# Patient Record
Sex: Female | Born: 1995 | Race: Black or African American | Hispanic: No | Marital: Single | Smoking: Never smoker
Health system: Southern US, Community
[De-identification: ages and names within clinical notes are randomized; demographics above are authoritative.]

## PROBLEM LIST (undated history)

## (undated) DIAGNOSIS — R51 Headache: Secondary | ICD-10-CM

## (undated) DIAGNOSIS — F32A Depression, unspecified: Secondary | ICD-10-CM

## (undated) DIAGNOSIS — F419 Anxiety disorder, unspecified: Secondary | ICD-10-CM

## (undated) DIAGNOSIS — F909 Attention-deficit hyperactivity disorder, unspecified type: Secondary | ICD-10-CM

## (undated) DIAGNOSIS — R519 Headache, unspecified: Secondary | ICD-10-CM

## (undated) DIAGNOSIS — F329 Major depressive disorder, single episode, unspecified: Secondary | ICD-10-CM

## (undated) HISTORY — DX: Major depressive disorder, single episode, unspecified: F32.9

## (undated) HISTORY — DX: Anxiety disorder, unspecified: F41.9

## (undated) HISTORY — DX: Depression, unspecified: F32.A

---
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
## 2017-12-01 ENCOUNTER — Emergency Department
Admission: EM | Admit: 2017-12-01 | Discharge: 2017-12-01 | Disposition: A | Discharge status: Home / Self Care | Payer: BLUE CROSS/BLUE SHIELD | Source: Home / Self Care | Attending: Family Medicine | Admitting: Family Medicine

## 2017-12-01 ENCOUNTER — Encounter: Payer: Self-pay | Admitting: *Deleted

## 2017-12-01 ENCOUNTER — Other Ambulatory Visit: Payer: Self-pay

## 2017-12-01 ENCOUNTER — Emergency Department (INDEPENDENT_AMBULATORY_CARE_PROVIDER_SITE_OTHER): Payer: BLUE CROSS/BLUE SHIELD

## 2017-12-01 DIAGNOSIS — M25552 Pain in left hip: Secondary | ICD-10-CM

## 2017-12-01 NOTE — ED Triage Notes (Signed)
Patient c/o 1 week of left hip pain without injury.

## 2017-12-01 NOTE — Discharge Instructions (Signed)
°  You may take 500mg  acetaminophen every 4-6 hours or in combination with ibuprofen 400-600mg  every 6-8 hours as needed for pain and inflammation.  Please call to schedule an appointment with Sports Medicine by next week for recheck of symptoms and for ongoing muscle and joint pain including hip, back, and shoulder pain.

## 2017-12-01 NOTE — ED Provider Notes (Signed)
[facility]    CSN: [id number] Arrival date & time: 12/01/17  1657     History   Chief Complaint Chief Complaint  Patient presents with  . Hip Pain    HPI Melanie Meyer is a 22 y.o. female.   HPI  Melanie Meyer is a 22 y.o. female presenting to UC with mother c/o Left hip pain that is aching and sore, waxing and waning for about 1 week. No known injury.   She initially thought she had slept wrong but pain has continued. She has taken ibuprofen a few times with mild temporary relief. Denies radiation of pain. No rash or bruising. No prior hx of hip pain but she has been seen at [facility] for back and shoulder pain in the past. She has not been seen by an orthopedist or sports medicine for her musculoskeletal pain.    Past Medical History:  Diagnosis Date  . Frequent headaches     Patient Active Problem List   Diagnosis Date Noted  . ANKLE SPRAIN, RIGHT 06/03/2009    History reviewed. No pertinent surgical history.  OB History   None      Home Medications    Prior to Admission medications   Medication Sig Start Date End Date Taking? Authorizing Provider  cyclobenzaprine (FLEXERIL) 5 MG tablet Take 1-2 tablets (5-10 mg total) by mouth 2 (two) times daily as needed for muscle spasms. 08/30/16  Yes Vanda Waskey O, PA-C  FLUoxetine (PROZAC) 40 MG capsule Take 40 mg by mouth daily.   Yes [provider]  norethindrone-ethinyl estradiol (JUNEL FE,GILDESS FE,LOESTRIN FE) 1-20 MG-MCG tablet Take 1 tablet by mouth daily.   Yes [provider]    Family History History reviewed. No pertinent family history.  Social History Social History   Tobacco Use  . Smoking status: Never Smoker  . Smokeless tobacco: Never Used  Substance Use Topics  . Alcohol use: No  . Drug use: No     Allergies   Patient has no known allergies.   Review of Systems Review of Systems  Musculoskeletal: Positive for arthralgias and myalgias. Negative for  back pain, gait problem and joint swelling.  Neurological: Negative for weakness and numbness.     Physical Exam Triage Vital Signs ED Triage Vitals  Enc Vitals Group     BP 12/01/17 1714 112/76     Pulse Rate 12/01/17 1714 77     Resp --      Temp --      Temp src --      SpO2 12/01/17 1714 99 %     Weight 12/01/17 1715 107 lb (48.5 kg)     Height --      Head Circumference --      Peak Flow --      Pain Score 12/01/17 1715 5     Pain Loc --      Pain Edu? --      Excl. in GC? --    No data found.  Updated Vital Signs BP 112/76 (BP Location: Right Arm)   Pulse 77   Wt 107 lb (48.5 kg)   LMP 11/09/2017   SpO2 99%   BMI 20.22 kg/m   Visual Acuity Right Eye Distance:   Left Eye Distance:   Bilateral Distance:    Right Eye Near:   Left Eye Near:    Bilateral Near:     Physical Exam  Constitutional: She is oriented to person, place, and time. She  appears well-developed and well-nourished.  HENT:  Head: Normocephalic and atraumatic.  Eyes: EOM are normal.  Neck: Normal range of motion.  Cardiovascular: Normal rate.  Pulmonary/Chest: Effort normal.  Musculoskeletal: Normal range of motion. She exhibits tenderness. She exhibits no edema.  Left hip: tenderness to muscle and joint. Full ROM. Normal gait. No tenderness to lumbar spine or muscles.  Neurological: She is alert and oriented to person, place, and time.  Skin: Skin is warm and dry.  Psychiatric: She has a normal mood and affect. Her behavior is normal.  Nursing note and vitals reviewed.    UC Treatments / Results  Labs (all labs ordered are listed, but only abnormal results are displayed) Labs Reviewed - No data to display  EKG None  Radiology Dg Hip Unilat W Or Wo Pelvis 2-3 Views Left  Result Date: 12/01/2017 CLINICAL DATA:  Left hip pain for the past week.  No known injury. EXAM: DG HIP (WITH OR WITHOUT PELVIS) 2-3V LEFT COMPARISON:  None. FINDINGS: There is no evidence of hip fracture or  dislocation. There is no evidence of arthropathy or other focal bone abnormality. IMPRESSION: Negative. Electronically Signed   By: Obie Dredge M.D.   On: 12/01/2017 17:58    Procedures Procedures (including critical care time)  Medications Ordered in UC Medications - No data to display  Initial Impression / Assessment and Plan / UC Course  I have reviewed the triage vital signs and the nursing notes.  Pertinent labs & imaging results that were available during my care of the patient were reviewed by me and considered in my medical decision making (see chart for details).     Reassured pt and mother of normal plain films Encouraged symptomatic treatment Pt info packet provided. Also encouraged f/u with Sports Medicine.  Final Clinical Impressions(s) / UC Diagnoses   Final diagnoses:  Left hip pain     Discharge Instructions      You may take 500mg  acetaminophen every 4-6 hours or in combination with ibuprofen 400-600mg  every 6-8 hours as needed for pain and inflammation.  Please call to schedule an appointment with Sports Medicine by next week for recheck of symptoms and for ongoing muscle and joint pain including hip, back, and shoulder pain.      ED Prescriptions    None     Controlled Substance Prescriptions [place] Controlled Substance Registry consulted? Not Applicable   Rolla Plate 12/01/17 Rickey Primus

## 2018-03-27 ENCOUNTER — Encounter (HOSPITAL_COMMUNITY): Payer: Self-pay | Admitting: Psychiatry

## 2018-03-27 ENCOUNTER — Encounter (INDEPENDENT_AMBULATORY_CARE_PROVIDER_SITE_OTHER): Payer: Self-pay

## 2018-03-27 ENCOUNTER — Ambulatory Visit (INDEPENDENT_AMBULATORY_CARE_PROVIDER_SITE_OTHER): Payer: BLUE CROSS/BLUE SHIELD | Admitting: Psychiatry

## 2018-03-27 ENCOUNTER — Other Ambulatory Visit: Payer: Self-pay

## 2018-03-27 DIAGNOSIS — F324 Major depressive disorder, single episode, in partial remission: Secondary | ICD-10-CM | POA: Diagnosis not present

## 2018-03-27 DIAGNOSIS — F411 Generalized anxiety disorder: Secondary | ICD-10-CM | POA: Diagnosis not present

## 2018-03-27 MED ORDER — ESCITALOPRAM OXALATE 20 MG PO TABS: 20.0000 mg | ORAL_TABLET | Freq: Every day | ORAL | 1 refills | Status: DC

## 2018-03-27 NOTE — Progress Notes (Signed)
Psychiatric Initial Adult Assessment   Patient Identification: Melanie Meyer MRN:  [id number] Date of Evaluation:  03/27/2018 [facility] Chief Complaint:   Chief Complaint    Establish Care; Anxiety; Depression; Panic Attack     Visit Diagnosis:    ICD-10-CM   1. Major depression single episode, in partial remission (HCC) F32.4   2. 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  Her family is from [place] and includes both parents 47 year old brother and 67 year old sister.  The patient also works part-time at the [employer].  The patient was referred by [facility] pediatrics for further follow-up and treatment of depression and anxiety.  The patient states that she did have some feelings of anxiety through her high school years but it was never very severe.  She saw a counselor for short time and the seem to be alleviated.  However during her freshman year at [employer] she had a bad anxiety episode 1 night in the dorm when she was trying to do homework and became overwhelmed and frightened.  At that time she was also very anxious and depressed.  She did not feel that high school had prepared her for college and she felt extremely overwhelmed.  She saw therapist and a psychiatrist at the school and was started on Prozac she states that she had trouble getting into the psychiatrist after a while because the doctor was only there part-time and it did not mesh with her schedule.  She stayed on the Prozac "on and off".  She did not like the way it made her feel and that she felt more unfocused and unmotivated.  A couple of months ago her anxiety and depression worsened and she did not know where to turn for help.  Her parents brought her to [facility] and she was started on Lexapro 20 mg daily.  She is also seeing a  therapist at [facility] again on a weekly basis.  She states that she is feeling a lot better now but prior to going on the Lexapro she was having panic attacks feeling anxious depressed sad and even some suicidal ideation.  Last summer she attempted to cut herself for short time but then her brother found out and she stopped.  Before going on medication she also had difficulty sleeping but this is going better now.  She had some difficulties with academics in the beginning of college because she was not really prepared but is doing much better now and making use of the academic support at the school.  She has never had any psychiatric hospitalizations or serious suicide attempts.  She is never had psychotic symptoms and does not use drugs alcohol cigarettes or vaping.  Associated Signs/Symptoms: Depression Symptoms:  depressed mood, anhedonia, psychomotor retardation, difficulty concentrating, anxiety, (Hypo) Manic Symptoms: Anxiety Symptoms:  Excessive Worry, Panic Symptoms, Psychotic Symptoms:   PTSD Symptoms: No history of past trauma or abuse  Past Psychiatric History: Prior outpatient treatment at [facility]  Previous Psychotropic Medications: Yes   Substance Abuse History in the last 12 months:  No.  Consequences of Substance Abuse: Negative  Past Medical History:  Past Medical History:  Diagnosis Date  . Anxiety   . Depression   . Frequent headaches    History reviewed. No pertinent surgical history.  Family Psychiatric History: Both brother and sister have depression and  anxiety.  Family History:  Family History  Problem Relation Age of Onset  . Depression Sister   . Anxiety disorder Sister   . Depression Brother   . Anxiety disorder Brother     Social History:   Social History   Socioeconomic History  . Marital status: Single    Spouse name: Not on file  . Number of children: 0  . Years of education: Not on file  . Highest education level: Some college, no degree   Occupational History  . Not on file  Social Needs  . Financial resource strain: Very hard  . Food insecurity:    Worry: Often true    Inability: Often true  . Transportation needs:    Medical: No    Non-medical: No  Tobacco Use  . Smoking status: Never Smoker  . Smokeless tobacco: Never Used  Substance and Sexual Activity  . Alcohol use: No  . Drug use: No  . Sexual activity: Never  Lifestyle  . Physical activity:    Days per week: 1 day    Minutes per session: 100 min  . Stress: Rather much  Relationships  . Social connections:    Talks on phone: Not on file    Gets together: Not on file    Attends religious service: More than 4 times per year    Active member of club or organization: Yes    Attends meetings of clubs or organizations: More than 4 times per year    Relationship status: Never married  Other Topics Concern  . Not on file  Social History Narrative  . Not on file    Additional Social History: The patient grew up in [place] with both parents and 2 siblings.  She states that she had a happy childhood and does not endorse any past abuse or trauma.  She did well in high school at [employer] but claims it was "too easy" and not challenging enough to prepare for college.  She enjoys college now has numerous friends but is not dating.  She likes to go to movies or watch movies in her dorm.  She exercises about once a week  Allergies:  No Known Allergies  Metabolic Disorder Labs: No results found for: HGBA1C, MPG No results found for: PROLACTIN No results found for: CHOL, TRIG, HDL, CHOLHDL, VLDL, LDLCALC No results found for: TSH  Therapeutic Level Labs: No results found for: LITHIUM No results found for: CBMZ No results found for: VALPROATE  Current Medications: Current Outpatient Medications  Medication Sig Dispense Refill  . escitalopram (LEXAPRO) 20 MG tablet Take 1 tablet (20 mg total) by mouth daily. 90 tablet 1  . norethindrone-ethinyl  estradiol (JUNEL FE,GILDESS FE,LOESTRIN FE) 1-20 MG-MCG tablet Take 1 tablet by mouth daily.    . rizatriptan (MAXALT) 5 MG tablet TAKE 1 TABLET (5 MG TOTAL) BY MOUTH AS NEEDED FOR MIGRAINE. MAY REPEAT IN 2 HOURS IF NEEDED    . triamcinolone cream (KENALOG) 0.1 % Apply to atopic dermatitis on ears and neck 1-2 times a day as needed     No current facility-administered medications for this visit.     Musculoskeletal: Strength & Muscle Tone: within normal limits Gait & Station: normal Patient leans: N/A  Psychiatric Specialty Exam: Review of Systems  Neurological: Positive for headaches.  All other systems reviewed and are negative.   Blood pressure 122/84, pulse 81, temperature 99 F (37.2 C), temperature source Oral, weight 105 lb 3.2 oz (47.7 kg), last menstrual  period 03/01/2018, SpO2 97 %.Body mass index is 19.88 kg/m.  General Appearance: Casual, Neat and Well Groomed  Eye Contact:  Good  Speech:  Clear and Coherent  Volume:  Normal  Mood:  Euthymic  Affect:  Appropriate and Congruent  Thought Process:  Goal Directed  Orientation:  Full (Time, Place, and Person)  Thought Content:  WDL  Suicidal Thoughts:  No  Homicidal Thoughts:  No  Memory:  Immediate;   Good Recent;   Good Remote;   Good  Judgement:  Good  Insight:  Good  Psychomotor Activity:  Normal  Concentration:  Concentration: Good and Attention Span: Good  Recall:  Good  Fund of Knowledge:Good  Language: Good  Akathisia:  No  Handed:  Right  AIMS (if indicated):  not done  Assets:  Communication Skills Desire for Improvement Physical Health Resilience Social Support Talents/Skills Vocational/Educational  ADL's:  Intact  Cognition: WNL  Sleep:  Fair   Screenings:   Assessment and Plan: This patient is a 23 year old female with a history of depression anxiety and panic attacks.  Fortunately she is in [facility].  She seems to be having a good response to Lexapro with much diminishment  of symptoms.  She will continue Lexapro 20 mg daily.  I have advised her to return for checkup here in 6 weeks with a psychiatrist.   Diannia Rudereborah Trevione Wert, MD 2/8/20202:37 PM

## 2018-04-07 ENCOUNTER — Ambulatory Visit (HOSPITAL_COMMUNITY): Payer: Self-pay | Admitting: Psychiatry

## 2018-04-20 ENCOUNTER — Other Ambulatory Visit: Payer: Self-pay

## 2018-04-20 ENCOUNTER — Emergency Department
Admission: EM | Admit: 2018-04-20 | Discharge: 2018-04-20 | Disposition: A | Discharge status: Home / Self Care | Payer: BLUE CROSS/BLUE SHIELD | Source: Home / Self Care

## 2018-04-20 DIAGNOSIS — R110 Nausea: Secondary | ICD-10-CM

## 2018-04-20 DIAGNOSIS — G44209 Tension-type headache, unspecified, not intractable: Secondary | ICD-10-CM

## 2018-04-20 MED ORDER — ONDANSETRON 4 MG PO TBDP
4.0000 mg | ORAL_TABLET | Freq: Once | ORAL | Status: AC
  Administered 2018-04-20: 4 mg via ORAL

## 2018-04-20 MED ORDER — KETOROLAC TROMETHAMINE 60 MG/2ML IM SOLN
60.0000 mg | Freq: Once | INTRAMUSCULAR | Status: AC
  Administered 2018-04-20: 60 mg via INTRAMUSCULAR

## 2018-04-20 MED ORDER — ONDANSETRON 4 MG PO TBDP: 4.0000 mg | ORAL_TABLET | Freq: Three times a day (TID) | ORAL | 0 refills | Status: DC | PRN

## 2018-04-20 MED ORDER — CYCLOBENZAPRINE HCL 5 MG PO TABS: 5.0000 mg | ORAL_TABLET | Freq: Two times a day (BID) | ORAL | 0 refills | Status: DC | PRN

## 2018-04-20 NOTE — Discharge Instructions (Signed)
  You may take 500mg acetaminophen every 4-6 hours or in combination with ibuprofen 400-600mg every 6-8 hours as needed for pain and inflammation.  

## 2018-04-20 NOTE — ED Provider Notes (Signed)
[facility]    CSN: [id number] Arrival date & time: 04/20/18  1857     History   Chief Complaint Chief Complaint  Patient presents with  . Nausea    HPI Melanie Meyer is a 23 y.o. female.   HPI  Melanie Meyer is a 23 y.o. female presenting to UC with c/o neck tightness with associated posterior headache and nausea.  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  She has been working this shift for about 6 months and had not had any problems until this past 1 week.  The HA is a pressure-like sensation.  She has tried Maxalt, which she has for migraines but no relief. Her usual migraines are in the front or side of her head. Denies recent head injury. No change in vision, balance or speech.  She has not tried any Tylenol or Motrin because she thought it was a migraine so she only tried the Maxalt. Denies concern for pregnancy.  LMP 03/22/2018.    Past Medical History:  Diagnosis Date  . Anxiety   . Depression   . Frequent headaches     Patient Active Problem List   Diagnosis Date Noted  . Major depression single episode, in partial remission (HCC) 03/27/2018  . Generalized anxiety disorder 03/27/2018  . Dysmenorrhea in adolescent 10/25/2013  . Headache 11/17/2012  . ANKLE SPRAIN, RIGHT 06/03/2009    History reviewed. No pertinent surgical history.  OB History   No obstetric history on file.      Home Medications    Prior to Admission medications   Medication Sig Start Date End Date Taking? Authorizing Provider  cyclobenzaprine (FLEXERIL) 5 MG tablet Take 1-2 tablets (5-10 mg total) by mouth 2 (two) times daily as needed for muscle spasms. 04/20/18   Lurene ShadowPhelps, Markelle Najarian O, PA-C  escitalopram (LEXAPRO) 20 MG tablet Take 1 tablet (20 mg total) by mouth daily. 03/27/18   Myrlene Brokeross, Deborah R, MD  fluticasone (CUTIVATE) 0.05 % cream APPLY TO AFFECTED AREA EVERY DAY 03/31/18   [provider]  norethindrone-ethinyl estradiol  (JUNEL FE,GILDESS FE,LOESTRIN FE) 1-20 MG-MCG tablet Take 1 tablet by mouth daily.    [provider]  ondansetron (ZOFRAN ODT) 4 MG disintegrating tablet Take 1 tablet (4 mg total) by mouth every 8 (eight) hours as needed. 4mg  ODT q4 hours prn nausea/vomit 04/20/18   Jordi Kamm, Vangie BickerErin O, PA-C  rizatriptan (MAXALT) 5 MG tablet TAKE 1 TABLET (5 MG TOTAL) BY MOUTH AS NEEDED FOR MIGRAINE. MAY REPEAT IN 2 HOURS IF NEEDED 09/27/14   [provider]  triamcinolone cream (KENALOG) 0.1 % Apply to atopic dermatitis on ears and neck 1-2 times a day as needed 02/16/17   [provider]    Family History Family History  Problem Relation Age of Onset  . Depression Sister   . Anxiety disorder Sister   . Depression Brother   . Anxiety disorder Brother     Social History Social History   Tobacco Use  . Smoking status: Never Smoker  . Smokeless tobacco: Never Used  Substance Use Topics  . Alcohol use: No  . Drug use: No     Allergies   Patient has no known allergies.   Review of Systems Review of Systems  Constitutional: Negative for chills and fever.  HENT: Negative for congestion, ear pain, sore throat, trouble swallowing and voice change.   Respiratory: Negative for cough and shortness of breath.   Cardiovascular: Negative  for chest pain and palpitations.  Gastrointestinal: Positive for nausea. Negative for abdominal pain, diarrhea and vomiting.  Musculoskeletal: Positive for myalgias, neck pain and neck stiffness. Negative for arthralgias and back pain.  Skin: Negative for rash.  Neurological: Positive for headaches. Negative for dizziness, weakness, light-headedness and numbness.     Physical Exam Triage Vital Signs ED Triage Vitals  Enc Vitals Group     BP 04/20/18 1918 107/76     Pulse Rate 04/20/18 1918 69     Resp 04/20/18 1918 20     Temp 04/20/18 1918 98.3 F (36.8 C)     Temp Source 04/20/18 1918 Oral     SpO2 04/20/18 1918 99 %     Weight 04/20/18  1921 105 lb (47.6 kg)     Height 04/20/18 1921 5\' 1"  (1.549 m)     Head Circumference --      Peak Flow --      Pain Score 04/20/18 1920 5     Pain Loc --      Pain Edu? --      Excl. in GC? --    No data found.  Updated Vital Signs BP 107/76 (BP Location: Right Arm)   Pulse 69   Temp 98.3 F (36.8 C) (Oral)   Resp 20   Ht 5\' 1"  (1.549 m)   Wt 105 lb (47.6 kg)   LMP 03/22/2018   SpO2 99%   BMI 19.84 kg/m   Visual Acuity Right Eye Distance:   Left Eye Distance:   Bilateral Distance:    Right Eye Near:   Left Eye Near:    Bilateral Near:     Physical Exam Vitals signs and nursing note reviewed.  Constitutional:      Appearance: Normal appearance. She is well-developed.  HENT:     Head: Normocephalic and atraumatic.     Right Ear: Tympanic membrane normal.     Left Ear: Tympanic membrane normal.     Nose: Nose normal.     Right Sinus: No maxillary sinus tenderness or frontal sinus tenderness.     Left Sinus: No maxillary sinus tenderness or frontal sinus tenderness.     Mouth/Throat:     Lips: Pink.     Mouth: Mucous membranes are moist.     Pharynx: Oropharynx is clear. Uvula midline.  Eyes:     Extraocular Movements: Extraocular movements intact.     Conjunctiva/sclera: Conjunctivae normal.     Pupils: Pupils are equal, round, and reactive to light.  Neck:     Musculoskeletal: Normal range of motion and neck supple. Muscular tenderness present. No neck rigidity, torticollis or spinous process tenderness.  Cardiovascular:     Rate and Rhythm: Normal rate and regular rhythm.  Pulmonary:     Effort: Pulmonary effort is normal. No respiratory distress.     Breath sounds: Normal breath sounds. No stridor. No wheezing or rhonchi.  Musculoskeletal: Normal range of motion.  Skin:    General: Skin is warm and dry.     Capillary Refill: Capillary refill takes less than 2 seconds.  Neurological:     General: No focal deficit present.     Mental Status: She is  alert and oriented to person, place, and time.     Cranial Nerves: No cranial nerve deficit.     Sensory: No sensory deficit.     Coordination: Coordination normal.     Gait: Gait normal.  Psychiatric:        Mood  and Affect: Mood normal.        Behavior: Behavior normal.      UC Treatments / Results  Labs (all labs ordered are listed, but only abnormal results are displayed) Labs Reviewed - No data to display  EKG None  Radiology No results found.  Procedures Procedures (including critical care time)  Medications Ordered in UC Medications  ketorolac (TORADOL) injection 60 mg (60 mg Intramuscular Given 04/20/18 2000)  ondansetron (ZOFRAN-ODT) disintegrating tablet 4 mg (4 mg Oral Given 04/20/18 2000)    Initial Impression / Assessment and Plan / UC Course  I have reviewed the triage vital signs and the nursing notes.  Pertinent labs & imaging results that were available during my care of the patient were reviewed by me and considered in my medical decision making (see chart for details).     Hx and exam c/w tension headache Normal neuro exam Home care info provided  Final Clinical Impressions(s) / UC Diagnoses   Final diagnoses:  Tension headache  Nausea without vomiting     Discharge Instructions      You may take 500mg  acetaminophen every 4-6 hours or in combination with ibuprofen 400-600mg  every 6-8 hours as needed for pain and inflammation.       ED Prescriptions    Medication Sig Dispense Auth. Provider   cyclobenzaprine (FLEXERIL) 5 MG tablet Take 1-2 tablets (5-10 mg total) by mouth 2 (two) times daily as needed for muscle spasms. 30 tablet Doroteo Glassman, Alija Riano O, PA-C   ondansetron (ZOFRAN ODT) 4 MG disintegrating tablet Take 1 tablet (4 mg total) by mouth every 8 (eight) hours as needed. 4mg  ODT q4 hours prn nausea/vomit 12 tablet Lurene Shadow, PA-C     Controlled Substance Prescriptions [place] Controlled Substance Registry consulted? Not Applicable     Rolla Plate 04/23/18 9977

## 2018-04-20 NOTE — ED Triage Notes (Signed)
Nausea for about 1 week.  Comes home from working nights, and feels nauseated.  Denies vomiting.  Slight headache in the back of head, has been happening more with the nausea.  Headache feels like something is pushing on the back of head.  Has taken maxalt for the headache, nothing for nausea.  Pt feels like the maxalt helps, but is worried about the amount of Maxalt she is taking.

## 2018-05-01 ENCOUNTER — Other Ambulatory Visit: Payer: Self-pay

## 2018-05-01 ENCOUNTER — Encounter (HOSPITAL_COMMUNITY): Payer: Self-pay | Admitting: Psychiatry

## 2018-05-01 ENCOUNTER — Ambulatory Visit (INDEPENDENT_AMBULATORY_CARE_PROVIDER_SITE_OTHER): Payer: BLUE CROSS/BLUE SHIELD | Admitting: Psychiatry

## 2018-05-01 VITALS — BP 109/72 | HR 85 | Temp 98.3°F | Wt 106.4 lb

## 2018-05-01 DIAGNOSIS — F411 Generalized anxiety disorder: Secondary | ICD-10-CM | POA: Diagnosis not present

## 2018-05-01 DIAGNOSIS — F331 Major depressive disorder, recurrent, moderate: Secondary | ICD-10-CM | POA: Diagnosis not present

## 2018-05-01 DIAGNOSIS — Z818 Family history of other mental and behavioral disorders: Secondary | ICD-10-CM | POA: Diagnosis not present

## 2018-05-01 MED ORDER — ESCITALOPRAM OXALATE 20 MG PO TABS: 20.0000 mg | ORAL_TABLET | Freq: Every day | ORAL | 0 refills | Status: DC

## 2018-05-01 NOTE — Progress Notes (Signed)
BH MD/PA/NP OP Progress Note  05/01/2018 10:59 AM Earney MalletJessica Chrismer  MRN:  [id number]  Chief Complaint:  Chief Complaint    Depression; Anxiety; Follow-up     HPI: Patient reports extreme anxiety related to the current plans for dealing with the coronavirus pandemic.  She was given 3 days to make plans as [facility] has officially closed for at least 1 week.  Patient reports that being home with unstructured days typically causes her to feel unmotivated, fatigued and depressed.  She has been doing well lately.  She states that her depression seem to be under control.  She is very active doing well in school and was very motivated.  She was very social.  Sleep was good.  She is denying any SI/HI.  She was engaging in therapy and actively utilizing skills that she learned for managing anxiety.  Her anxiety while present was manageable.  She has had a few mild panic attacks.  This upcoming uncertainty has been extremely difficult for her to deal with and she is very worried that she will become depressed again.   Visit Diagnosis:    ICD-10-CM   1. GAD (generalized anxiety disorder) F41.1   2. MDD (major depressive disorder), recurrent episode, moderate (HCC) F33.1     Past Psychiatric History: reviewed. No change  Past Medical History:  Past Medical History:  Diagnosis Date  . Anxiety   . Depression   . Frequent headaches    History reviewed. No pertinent surgical history.  Family Psychiatric History:  Family History  Problem Relation Age of Onset  . Depression Sister   . Anxiety disorder Sister   . Depression Brother   . Anxiety disorder Brother     Social History:  Social History   Socioeconomic History  . Marital status: Single    Spouse name: Not on file  . Number of children: 0  . Years of education: Not on file  . Highest education level: Some college, no degree  Occupational History  . Not on file  Social Needs  . Financial resource strain: Very hard  .  Food insecurity:    Worry: Often true    Inability: Often true  . Transportation needs:    Medical: No    Non-medical: No  Tobacco Use  . Smoking status: Never Smoker  . Smokeless tobacco: Never Used  Substance and Sexual Activity  . Alcohol use: No  . Drug use: No  . Sexual activity: Never  Lifestyle  . Physical activity:    Days per week: 1 day    Minutes per session: 100 min  . Stress: Rather much  Relationships  . Social connections:    Talks on phone: Not on file    Gets together: Not on file    Attends religious service: More than 4 times per year    Active member of club or organization: Yes    Attends meetings of clubs or organizations: More than 4 times per year    Relationship status: Never married  Other Topics Concern  . Not on file  Social History Narrative  . Not on file    Allergies: No Known Allergies  Metabolic Disorder Labs: No results found for: HGBA1C, MPG No results found for: PROLACTIN No results found for: CHOL, TRIG, HDL, CHOLHDL, VLDL, LDLCALC No results found for: TSH  Therapeutic Level Labs: No results found for: LITHIUM No results found for: VALPROATE No components found for:  CBMZ  Current Medications: Current Outpatient Medications  Medication Sig Dispense Refill  . cyclobenzaprine (FLEXERIL) 5 MG tablet Take 1-2 tablets (5-10 mg total) by mouth 2 (two) times daily as needed for muscle spasms. 30 tablet 0  . EPIDUO FORTE 0.3-2.5 % GEL     . escitalopram (LEXAPRO) 20 MG tablet Take 1 tablet (20 mg total) by mouth daily. 90 tablet 0  . fluticasone (CUTIVATE) 0.05 % cream APPLY TO AFFECTED AREA EVERY DAY    . norethindrone-ethinyl estradiol (JUNEL FE,GILDESS FE,LOESTRIN FE) 1-20 MG-MCG tablet Take 1 tablet by mouth daily.    . ondansetron (ZOFRAN ODT) 4 MG disintegrating tablet Take 1 tablet (4 mg total) by mouth every 8 (eight) hours as needed. 4mg  ODT q4 hours prn nausea/vomit 12 tablet 0  . rizatriptan (MAXALT) 5 MG tablet TAKE 1  TABLET (5 MG TOTAL) BY MOUTH AS NEEDED FOR MIGRAINE. MAY REPEAT IN 2 HOURS IF NEEDED    . tacrolimus (PROTOPIC) 0.03 % ointment APPLY TO AFFECTED AREA TWICE A DAY    . triamcinolone cream (KENALOG) 0.1 % Apply to atopic dermatitis on ears and neck 1-2 times a day as needed     No current facility-administered medications for this visit.      Musculoskeletal: Strength & Muscle Tone: within normal limits Gait & Station: normal Patient leans: N/A  Psychiatric Specialty Exam: Review of Systems  Constitutional: Negative for chills, fever and malaise/fatigue.  Respiratory: Negative for cough, sputum production and shortness of breath.     Blood pressure 109/72, pulse 85, temperature 98.3 F (36.8 C), temperature source Oral, weight 106 lb 5.8 oz (48.2 kg).Body mass index is 20.1 kg/m.  General Appearance: Casual  Eye Contact:  Good  Speech:  Clear and Coherent and Pressured  Volume:  Normal  Mood:  Anxious  Affect:  Full Range  Thought Process:  Coherent and Descriptions of Associations: Circumstantial  Orientation:  Full (Time, Place, and Person)  Thought Content: Rumination   Suicidal Thoughts:  No  Homicidal Thoughts:  No  Memory:  Immediate;   Good  Judgement:  Good  Insight:  Good  Psychomotor Activity:  Normal  Concentration:  Concentration: Good  Recall:  Good  Fund of Knowledge: Good  Language: Good  Akathisia:  No  Handed:  Right  AIMS (if indicated): not done  Assets:  Communication Skills Desire for Improvement Financial Resources/Insurance Housing Social Support Talents/Skills Transportation Vocational/Educational  ADL's:  Intact  Cognition: WNL  Sleep:  Good   Screenings:   Assessment and Plan: GAD; MDD-recurrent, moderate    Medication management with supportive therapy. Risks and benefits, side effects and alternative treatment options discussed with patient. Pt was given an opportunity to ask questions about medication, illness, and treatment.  All current psychiatric medications have been reviewed and discussed with the patient and adjusted as clinically appropriate. The patient has been provided an accurate and updated list of the medications being now prescribed. Pt verbalized understanding and verbal consent obtained for treatment.   Status of current problems: very anxious and worried she will become depressed  Meds: continue Lexapro 20mg  po qD for mood and anxiety   Labs: none today  Therapy: brief supportive therapy provided. Discussed psychosocial stressors in detail.   Encouraged pt to develop daily routine and work on daily goal setting as a way to improve mood symptoms.  Reviewed ways of responding to anxiety in a productive manner   Consultations: Encouraged to follow up with therapist at [facility] Encouraged to follow up with PCP as needed  Pt  denies SI and is at an acute low risk for suicide. Patient told to call clinic if any problems occur. Patient advised to go to ER if they should develop SI/HI, side effects, or if symptoms worsen. Pt has crisis numbers to call if needed. Pt acknowledged and agreed with plan and verbalized understanding.  F/up in 1 months or sooner if needed  The duration of this appointment visit was 20 minutes of face-to-face time with the patient.  Greater than 50% of this time was spent in counseling, explanation of  diagnosis, planning of further management, and coordination of care      Oletta Darter, MD 05/01/2018, 10:59 AM

## 2018-05-03 ENCOUNTER — Other Ambulatory Visit: Payer: Self-pay

## 2018-05-03 ENCOUNTER — Encounter: Payer: Self-pay | Admitting: Emergency Medicine

## 2018-05-03 ENCOUNTER — Emergency Department
Admission: EM | Admit: 2018-05-03 | Discharge: 2018-05-03 | Disposition: A | Discharge status: Home / Self Care | Payer: BLUE CROSS/BLUE SHIELD | Source: Home / Self Care

## 2018-05-03 DIAGNOSIS — M533 Sacrococcygeal disorders, not elsewhere classified: Secondary | ICD-10-CM | POA: Diagnosis not present

## 2018-05-03 NOTE — ED Triage Notes (Signed)
Pilonidal cyst on tail bone x 1 week

## 2018-05-03 NOTE — Discharge Instructions (Signed)
°  You may take 500mg  acetaminophen every 4-6 hours or in combination with ibuprofen 400-600mg  every 6-8 hours as needed for pain and inflammation. You may also try warm baths with epson salt to ease sore muscles and joints. If you have to sit on a hard surface for prolonged periods of time, it is recommended you bring a cushion with you.  Please follow up with family medicine later this week if not improving, sooner if worsening- fever, vomiting, redness or warmth in the area of pain.

## 2018-05-03 NOTE — ED Provider Notes (Signed)
[facility]    CSN: [id number] Arrival date & time: 05/03/18  1823     History   Chief Complaint Chief Complaint  Patient presents with  . Abscess    HPI Melanie Meyer is a 23 y.o. female.   HPI  Melanie Meyer is a 23 y.o. female presenting to UC with c/o tailbone pain for 1 week. 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 Pain is aching and sore. Denies fever, chills, n/v/d. She has not taken anything for pain. No prior hx of pilonidal cyst.     Past Medical History:  Diagnosis Date  . Anxiety   . Depression   . Frequent headaches     Patient Active Problem List   Diagnosis Date Noted  . Major depression single episode, in partial remission (HCC) 03/27/2018  . Generalized anxiety disorder 03/27/2018  . Dysmenorrhea in adolescent 10/25/2013  . Headache 11/17/2012  . ANKLE SPRAIN, RIGHT 06/03/2009    History reviewed. No pertinent surgical history.  OB History   No obstetric history on file.      Home Medications    Prior to Admission medications   Medication Sig Start Date End Date Taking? Authorizing Provider  cyclobenzaprine (FLEXERIL) 5 MG tablet Take 1-2 tablets (5-10 mg total) by mouth 2 (two) times daily as needed for muscle spasms. 04/20/18   Lurene Shadow, PA-C  EPIDUO FORTE 0.3-2.5 % GEL  04/12/18   [provider]  escitalopram (LEXAPRO) 20 MG tablet Take 1 tablet (20 mg total) by mouth daily. 05/01/18   Oletta Darter, MD  fluticasone (CUTIVATE) 0.05 % cream APPLY TO AFFECTED AREA EVERY DAY 03/31/18   [provider]  norethindrone-ethinyl estradiol (JUNEL FE,GILDESS FE,LOESTRIN FE) 1-20 MG-MCG tablet Take 1 tablet by mouth daily.    [provider]  ondansetron (ZOFRAN ODT) 4 MG disintegrating tablet Take 1 tablet (4 mg total) by mouth every 8 (eight) hours as needed. 4mg  ODT q4 hours prn nausea/vomit 04/20/18   Oda Placke O, PA-C  rizatriptan (MAXALT)  5 MG tablet TAKE 1 TABLET (5 MG TOTAL) BY MOUTH AS NEEDED FOR MIGRAINE. MAY REPEAT IN 2 HOURS IF NEEDED 09/27/14   [provider]  tacrolimus (PROTOPIC) 0.03 % ointment APPLY TO AFFECTED AREA TWICE A DAY 03/31/18   [provider]  triamcinolone cream (KENALOG) 0.1 % Apply to atopic dermatitis on ears and neck 1-2 times a day as needed 02/16/17   [provider]    Family History Family History  Problem Relation Age of Onset  . Depression Sister   . Anxiety disorder Sister   . Depression Brother   . Anxiety disorder Brother     Social History Social History   Tobacco Use  . Smoking status: Never Smoker  . Smokeless tobacco: Never Used  Substance Use Topics  . Alcohol use: No  . Drug use: No     Allergies   Patient has no known allergies.   Review of Systems Review of Systems  Constitutional: Negative for chills and fever.  Musculoskeletal: Positive for arthralgias. Negative for myalgias.  Skin: Negative for color change and wound.     Physical Exam Triage Vital Signs ED Triage Vitals  Enc Vitals Group     BP 05/03/18 1858 100/73     Pulse Rate 05/03/18 1858 93     Resp --      Temp 05/03/18 1858 98.3 F (36.8 C)  Temp Source 05/03/18 1858 Oral     SpO2 05/03/18 1858 100 %     Weight 05/03/18 1907 106 lb 4.2 oz (48.2 kg)     Height --      Head Circumference --      Peak Flow --      Pain Score 05/03/18 1906 6     Pain Loc --      Pain Edu? --      Excl. in GC? --    No data found.  Updated Vital Signs BP 100/73 (BP Location: Right Arm)   Pulse 93   Temp 98.3 F (36.8 C) (Oral)   Wt 106 lb 4.2 oz (48.2 kg)   LMP  (LMP Unknown)   SpO2 100%   BMI 20.08 kg/m   Visual Acuity Right Eye Distance:   Left Eye Distance:   Bilateral Distance:    Right Eye Near:   Left Eye Near:    Bilateral Near:     Physical Exam Vitals signs and nursing note reviewed. Exam conducted with a chaperone present.  Constitutional:       Appearance: Normal appearance. She is well-developed.  HENT:     Head: Normocephalic and atraumatic.  Neck:     Musculoskeletal: Normal range of motion.  Cardiovascular:     Rate and Rhythm: Normal rate.  Pulmonary:     Effort: Pulmonary effort is normal.  Genitourinary:    Comments: Tenderness on tailbone but no erythema, edema, induration or tenderness to buttock.  Musculoskeletal: Normal range of motion.  Skin:    General: Skin is warm and dry.     Capillary Refill: Capillary refill takes less than 2 seconds.  Neurological:     Mental Status: She is alert and oriented to person, place, and time.  Psychiatric:        Behavior: Behavior normal.      UC Treatments / Results  Labs (all labs ordered are listed, but only abnormal results are displayed) Labs Reviewed - No data to display  EKG None  Radiology No results found.  Procedures Procedures (including critical care time)  Medications Ordered in UC Medications - No data to display  Initial Impression / Assessment and Plan / UC Course  I have reviewed the triage vital signs and the nursing notes.  Pertinent labs & imaging results that were available during my care of the patient were reviewed by me and considered in my medical decision making (see chart for details).     Tenderness to tailbone w/o known injury No evidence of infection. Encouraged to take Tylenol and Motrin May also try soaks.  Final Clinical Impressions(s) / UC Diagnoses   Final diagnoses:  Tail bone pain     Discharge Instructions      You may take 500mg  acetaminophen every 4-6 hours or in combination with ibuprofen 400-600mg  every 6-8 hours as needed for pain and inflammation. You may also try warm baths with epson salt to ease sore muscles and joints. If you have to sit on a hard surface for prolonged periods of time, it is recommended you bring a cushion with you.  Please follow up with family medicine later this week if not  improving, sooner if worsening- fever, vomiting, redness or warmth in the area of pain.     ED Prescriptions    None     Controlled Substance Prescriptions [place] Controlled Substance Registry consulted? Not Applicable   Rolla Plate 05/04/18 [id number]

## 2018-06-24 ENCOUNTER — Ambulatory Visit (INDEPENDENT_AMBULATORY_CARE_PROVIDER_SITE_OTHER): Payer: BLUE CROSS/BLUE SHIELD | Admitting: Licensed Clinical Social Worker

## 2018-06-24 ENCOUNTER — Other Ambulatory Visit: Payer: Self-pay

## 2018-06-24 DIAGNOSIS — F411 Generalized anxiety disorder: Secondary | ICD-10-CM

## 2018-06-24 DIAGNOSIS — F331 Major depressive disorder, recurrent, moderate: Secondary | ICD-10-CM

## 2018-06-24 NOTE — Progress Notes (Signed)
Comprehensive Clinical Assessment (CCA) Note  06/24/2018 Melanie Meyer [id number]  Visit Diagnosis:      ICD-10-CM   1. GAD (generalized anxiety disorder) F41.1   2. MDD (major depressive disorder), recurrent episode, moderate (HCC) F33.1       CCA Part One  Part One has been completed on paper by the patient.  (See scanned document in Chart Review)  CCA Part Two A  Intake/Chief Complaint:  CCA Intake With Chief Complaint CCA Part Two Date: 06/24/18 Chief Complaint/Presenting Problem: Client reports not feeling productive and worried due to being at home. Client reports 'it messed up my routine and made me feel stress' Client reports being off routine, sporadic medication sometimes. 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 Patients Currently Reported Symptoms/Problems: frustrated with quick change in routine which she prevoiusly had balanced. Collateral Involvement: psychiatrist Type of Services Patient Feels Are Needed: previously receiving counseling [facility] Initial Clinical Notes/Concerns: Previously had difficult transition into college, not struggling with changes to routine. Client is a 23 year old female presenting as a referral to Outpatient related to anxiety and depression, with primary stressor being school. At the time of last meeting with primary psychiatrist client verbalized anxiety related to sudden changes and worry about becoming depressed again as she becomes unmotivated when not having a structured day. Previous anxiety, panic attacks, and depression managed with medication and therapy. Client has a history of receiving therapy through [facility] which she found effective. Client has history of 1 attempt of self-harm, no current behaviors. Client is single with no children. Client denies legal involvement or problematic substance use. Client has current anxiety  related to multiple changes which occurred quickly and her trouble with anxiety and lack of motivation with no set schedule. Client endorses concerns about perfectionism causing delays in getting work done which increases anxiety, and anxiety in turn causing her depression. Client would like to work on improving coping with anxiety and perfectionism to increase productivity and increase feelings of self worth by addressing unhelpful thoughts.  Mental Health Symptoms Depression:  Depression: Difficulty Concentrating  Mania:  Mania: N/A  Anxiety:   Anxiety: Difficulty concentrating, Irritability, Restlessness, Worrying  Psychosis:  Psychosis: N/A  Trauma:  Trauma: N/A  Obsessions:  Obsessions: Good insight, Cause anxiety(ruminating thoughts related to quick changes causing anxiety and frustration related to 'chaos' of pandemic)  Compulsions:  Compulsions: N/A  Inattention:  Inattention: N/A  Hyperactivity/Impulsivity:  Hyperactivity/Impulsivity: N/A  Oppositional/Defiant Behaviors:     Borderline Personality:  Emotional Irregularity: N/A  Other Mood/Personality Symptoms:      Mental Status Exam Appearance and self-care  Stature:  Stature: Average  Weight:  Weight: Average weight  Clothing:  Clothing: Casual  Grooming:  Grooming: Normal  Cosmetic use:  Cosmetic Use: Age appropriate  Posture/gait:  Posture/Gait: Normal  Motor activity:  Motor Activity: Not Remarkable  Sensorium  Attention:  Attention: Normal  Concentration:  Concentration: Anxiety interferes  Orientation:  Orientation: X5  Recall/memory:  Recall/Memory: Normal  Affect and Mood  Affect:  Affect: Anxious  Mood:  Mood: Anxious  Relating  Eye contact:  Eye Contact: Normal  Facial expression:  Facial Expression: Responsive  Attitude toward examiner:  Attitude Toward Examiner: Cooperative  Thought and Language  Speech flow: Speech Flow: Normal  Thought content:  Thought Content: Appropriate to mood and circumstances   Preoccupation:  Preoccupations: Obsessions, Ruminations  Hallucinations:  Hallucinations: Other (Comment)(n/a)  Organization:     Company secretaryxecutive Functions  Fund of Knowledge:  Fund of Knowledge: Average  Intelligence:  Intelligence: Average  Abstraction:  Abstraction: Normal  Judgement:  Judgement: Normal  Reality Testing:  Reality Testing: Realistic  Insight:  Insight: Good, Fair  Decision Making:  Decision Making: Normal  Social Functioning  Social Maturity:  Social Maturity: Responsible  Social Judgement:  Social Judgement: Normal  Stress  Stressors:  Stressors: Family conflict, Transitions, Work(school changes)  Coping Ability:  Coping Ability: Overwhelmed, Engineer, agriculturalesilient  Skill Deficits:     Supports:      Family and Psychosocial History: Family history Marital status: Single Are you sexually active?: No Does patient have children?: No  Childhood History:  Childhood History By whom was/is the patient raised?: Both parents Does patient have siblings?: Yes Number of Siblings: 1 Description of patient's current relationship with siblings: positive relationship with sister Did patient suffer any verbal/emotional/physical/sexual abuse as a child?: No Did patient suffer from severe childhood neglect?: No Has patient ever been sexually abused/assaulted/raped as an adolescent or adult?: No Was the patient ever a victim of a crime or a disaster?: No Witnessed domestic violence?: No Has patient been effected by domestic violence as an adult?: No  CCA Part Two B  Employment/Work Situation: Employment / Work Psychologist, occupationalituation Employment situation: Employed Where is patient currently employed?: [employer], when open Patient's job has been impacted by current illness: No Did You Receive Any Psychiatric Treatment/Services While in Equities traderthe Military?: No Are There Guns or Other Weapons in Your Home?: No Are These ComptrollerWeapons Safely Secured?: No  Education: Engineer, civil (consulting)ducation School Currently Attending: [employer] Last  Grade Completed: 12 Did Garment/textile technologistYou Graduate From [employer]?: Yes Did Theme park managerYou Attend College?: Yes What Type of College Degree Do you Have?: Working on [employer] 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 Did You Have An Individualized Education Program (IIEP): No Did You Have Any Difficulty At School?: Yes(prevoiusly trouble acclimating to college)  Religion: Religion/Spirituality Are You A Religious Person?: No  Leisure/Recreation: Leisure / Recreation Leisure and Hobbies: draw, watching movies, walking dog, hanging out with friends, videogames, cooking  Exercise/Diet: Exercise/Diet Do You Exercise?: Yes How Many Times a Week Do You Exercise?: 6-7 times a week Have You Gained or Lost A Significant Amount of Weight in the Past Six Months?: No Do You Follow a Special Diet?: No Do You Have Any Trouble Sleeping?: No  CCA Part Two C  Alcohol/Drug Use: Alcohol / Drug Use Pain Medications: see MAR Prescriptions: see MAR Over the Counter: see MAR History of alcohol / drug use?: No history of alcohol / drug abuse      CCA Part Three  ASAM's:  Six Dimensions of Multidimensional Assessment  Dimension 1:  Acute Intoxication and/or Withdrawal Potential:     Dimension 2:  Biomedical Conditions and Complications:     Dimension 3:  Emotional, Behavioral, or Cognitive Conditions and Complications:     Dimension 4:  Readiness to Change:     Dimension 5:  Relapse, Continued use, or Continued Problem Potential:     Dimension 6:  Recovery/Living Environment:      Substance use Disorder (SUD)    Social Function:  Social Functioning Social Maturity: Responsible Social Judgement: Normal  Stress:  Stress Stressors: Family conflict, Transitions, Work(school changes) Coping Ability: Overwhelmed, Resilient Patient Takes Medications The Way The Doctor Instructed?: Other (Comment)(Client primarily takes medication as prescribed generally, trouble with remembering now  that not in a daily routine)  Priority Risk: Low Acuity  Risk Assessment- Self-Harm Potential: Risk Assessment For Self-Harm Potential Thoughts of Self-Harm: No current thoughts  Risk Assessment -Dangerous to Others Potential: Risk Assessment For Dangerous to Others Potential Method: No Plan  DSM5 Diagnoses: Patient Active Problem List   Diagnosis Date Noted  . Major depression single episode, in partial remission (HCC) 03/27/2018  . Generalized anxiety disorder 03/27/2018  . Dysmenorrhea in adolescent 10/25/2013  . Headache 11/17/2012  . ANKLE SPRAIN, RIGHT 06/03/2009    Patient Centered Plan: Patient is on the following Treatment Plan(s):  Anxiety and Low Self-Esteem  Recommendations for Services/Supports/Treatments: Recommendations for Services/Supports/Treatments Recommendations For Services/Supports/Treatments: Individual Therapy, Medication Management("Helping with my anxiety in positions when i feel nervous about doing a particular task and helping with the perfectionism preventing me from finishing things")  Treatment Plan Summary: OP Treatment Plan Summary: Complete task without feeling nervous without doubting self(Improving feelings of self compassion at least 1 time per wk)  Referrals to Alternative Service(s): Referred to Alternative Service(s):   Place:   Date:   Time:    Referred to Alternative Service(s):   Place:   Date:   Time:    Referred to Alternative Service(s):   Place:   Date:   Time:    Referred to Alternative Service(s):   Place:   Date:   Time:     Harlon Ditty, LCSW

## 2018-07-08 ENCOUNTER — Ambulatory Visit (HOSPITAL_COMMUNITY): Payer: BLUE CROSS/BLUE SHIELD | Admitting: Licensed Clinical Social Worker

## 2018-07-19 ENCOUNTER — Other Ambulatory Visit: Payer: Self-pay

## 2018-07-19 ENCOUNTER — Ambulatory Visit (INDEPENDENT_AMBULATORY_CARE_PROVIDER_SITE_OTHER): Payer: BLUE CROSS/BLUE SHIELD | Admitting: Psychology

## 2018-07-19 DIAGNOSIS — F411 Generalized anxiety disorder: Secondary | ICD-10-CM

## 2018-07-19 DIAGNOSIS — F331 Major depressive disorder, recurrent, moderate: Secondary | ICD-10-CM | POA: Diagnosis not present

## 2018-07-19 NOTE — Progress Notes (Signed)
Virtual Visit via Telephone Note  I connected with Melanie Meyer on 07/19/18 at  2:30 PM EDT by telephone and verified that I am speaking with the correct person using two identifiers.   I discussed the limitations, risks, security and privacy concerns of performing an evaluation and management service by telephone and the availability of in person appointments. I also discussed with the patient that there may be a patient responsible charge related to this service. The patient expressed understanding and agreed to proceed.  I provided 44 minutes of non-face-to-face time during this encounter.   [facility]    THERAPIST PROGRESS NOTE  Session Time: 2.30pm-3.14pm  Participation Level: Active  Behavioral Response: Well GroomedAlertaffect wnl  Type of Therapy: Individual Therapy  Treatment Goals addressed: Diagnosis: GAD, MDD and goal 1.  Interventions: CBT, Strength-based and Supportive  Summary: Melanie Meyer is a 23 y.o. female who presents with affect wnl.  Pt is transfer from another therapist who completed the assessment.  Pt reports that she has struggled w/ depression and anxiety for several years and w/ sudden changes w/ pandemic and returning home and change of routine was worried about impact.  Pt reports she has been able to maintain contact w/ college friends, but misses the face to face interaction.  Recently as begun to see a couple of friends that are local and that has been good.  Pt reported that she does best w/ routine and structure and having that w/ going to classes and her job she enjoyed has benefited her mood.  Pt now is struggling to feel that she is productive and expectations to be productive for self.  Pt has considered volunteer or 2nd job but not sure what she wants or if efforts will not work out.  Pt is finding things to enjoy and keep herself busy w/- watching movies, reading books, video games, drawing, walking the dog, making pastries.  Pt  acknowledges that hard for her to see things through w/ projects or her art as she worries that won't like how turns out- perfectionist.  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   Suicidal/Homicidal: Nowithout intent/plan  Therapist Response: Assessed pt current functioning per pt report.  Processed w/pt coping w/ changes in routine and structure and loss of this w/ pandemic.  explored w/pt ways she is creating and modifying for self and seeking as work in progress and acceptance.  Assisted pt in identifying themes that are barriers for her and challenging those thought patterns.   Plan: Return again in 2 weeks, via webex.   I discussed the assessment and treatment plan with the patient. The patient was provided an opportunity to ask questions and all were answered. The patient agreed with the plan and demonstrated an understanding of the instructions.   The patient was advised to call back or seek an in-person evaluation if the symptoms worsen or if the condition fails to improve as anticipated.  Diagnosis: GAD, MDD   Forde Radon, [facility] 07/19/2018

## 2018-08-04 ENCOUNTER — Ambulatory Visit (INDEPENDENT_AMBULATORY_CARE_PROVIDER_SITE_OTHER): Payer: BC Managed Care – PPO | Admitting: Psychology

## 2018-08-04 ENCOUNTER — Other Ambulatory Visit: Payer: Self-pay

## 2018-08-04 DIAGNOSIS — F411 Generalized anxiety disorder: Secondary | ICD-10-CM | POA: Diagnosis not present

## 2018-08-04 DIAGNOSIS — F331 Major depressive disorder, recurrent, moderate: Secondary | ICD-10-CM

## 2018-08-04 NOTE — Progress Notes (Signed)
Virtual Visit via Video Note  I connected with Melanie Meyer on 08/04/18 at  2:30 PM EDT by a video enabled telemedicine application and verified that I am speaking with the correct person using two identifiers.   I discussed the limitations of evaluation and management by telemedicine and the availability of in person appointments. The patient expressed understanding and agreed to proceed.    I discussed the assessment and treatment plan with the patient. The patient was provided an opportunity to ask questions and all were answered. The patient agreed with the plan and demonstrated an understanding of the instructions.   The patient was advised to call back or seek an in-person evaluation if the symptoms worsen or if the condition fails to improve as anticipated.  I provided 51 minutes of non-face-to-face time during this encounter.   Melanie Meyer, [facility]    THERAPIST PROGRESS NOTE  Session Time: 2.30pm-3.11pm  Participation Level: Active  Behavioral Response: Well GroomedAlertaffect wnl  Type of Therapy: Individual Therapy  Treatment Goals addressed: Coping and Diagnosis: GAD, MDD and goal 1  Interventions: CBT and Supportive  Summary: Melanie Meyer is a 23 y.o. female who presents with affect wnl.  Pt reported that has been an eventful week.  Pt reported that had sister's graduation drive thru and celebrated at home following.  Pt reported that she had a panic attack following accidentally dropping something on dad's computer and didn't seem to be working intially. Pt discussed how she feels 2 sides to her one where she is anxiety and directed by this and one were she is able to see things w/ calm and rational.  Pt was able to gain awareness that best if both the emotional and rational sides can be present and work together.  Pt was able to also identitfy that she feels very guilty for having these panic attacks and parents having to see. Pt is able to see as distortion  and acknowledge that she can see a different perspective and ok to not be perfect and allow others to care.  Pt discussed anxiety about world around w/ pandemic and movements for black lives- feeling that she is have to be faced and feel pressured to address.  Pt discussed how she needs for self care to not consume this news and politcal opinions constantly.    Suicidal/Homicidal: Nowithout intent/plan  Therapist Response: Assessed pt current functioning per pt report. Processed w/pt recent stressors and panic attack. Discussed w/ pt her rational and emotional side and that both have their places and strengths and working to incorporate not be just directed by one or try to suppress either. Assisted pt in identify that ok to have boundaries for self care and to voice to family and friends and give self time to work through her response to these world issues.   Plan: Return again in 2 weeks, via webex.  Diagnosis: GAD, MDD Melanie Meyer [facility] 08/04/2018

## 2018-08-07 ENCOUNTER — Ambulatory Visit (HOSPITAL_COMMUNITY): Payer: BLUE CROSS/BLUE SHIELD | Admitting: Psychiatry

## 2018-08-07 ENCOUNTER — Encounter (HOSPITAL_COMMUNITY): Payer: Self-pay | Admitting: Psychiatry

## 2018-08-07 MED ORDER — ESCITALOPRAM OXALATE 20 MG PO TABS: 20.0000 mg | ORAL_TABLET | Freq: Every day | ORAL | 0 refills | Status: DC

## 2018-08-07 MED ORDER — BUSPIRONE HCL 5 MG PO TABS: 5.0000 mg | ORAL_TABLET | Freq: Two times a day (BID) | ORAL | 0 refills | Status: DC

## 2018-08-07 NOTE — Progress Notes (Unsigned)
Virtual Visit via Telephone Note  I connected with Melanie Meyer on 08/07/18 at 10:30 AM EDT by telephone and verified that I am speaking with the correct person using two identifiers.  Location: Patient: home Provider: office   I discussed the limitations, risks, security and privacy concerns of performing an evaluation and management service by telephone and the availability of in person appointments. I also discussed with the patient that there may be a patient responsible charge related to this service. The patient expressed understanding and agreed to proceed.   History of Present Illness: ***   Observations/Objective: I spoke with Melanie Meyer on the phone.  Pt was calm, pleasant and cooperative.  Pt was engaged in the conversation and answered questions appropriately.  Speech was clear and coherent with normal rate, tone and volume.  Mood is ***depressed and anxious, affect is congruent. Thought processes are coherent and intact.  Thought content is with ruminations***.  Pt denies SI/HI.   Pt denies auditory and visual hallucinations and did not appear to be responding to internal stimuli.  Memory and concentration are good.  Fund of knowledge and use of language are average.  Insight and judgment are fair.  I am unable to comment on psychomotor activity, general appearance, hygiene, or eye contact as I was unable to physically see the patient on the phone.   Assessment and Plan: GAD; MDD-recurrent, moderate  Lexapro 20mg  po qD for GAD and MDD Start trial of Buspar 5mg  BID for GAD and MDD  Follow Up Instructions: In 2 months or sooner if needed   I discussed the assessment and treatment plan with the patient. The patient was provided an opportunity to ask questions and all were answered. The patient agreed with the plan and demonstrated an understanding of the instructions.   The patient was advised to call back or seek an in-person evaluation if the symptoms worsen or  if the condition fails to improve as anticipated.  I provided 20 minutes of non-face-to-face time during this encounter.   Charlcie Cradle, MD

## 2018-08-19 ENCOUNTER — Ambulatory Visit (HOSPITAL_COMMUNITY): Payer: BC Managed Care – PPO | Admitting: Psychology

## 2018-09-08 ENCOUNTER — Other Ambulatory Visit: Payer: Self-pay

## 2018-09-08 ENCOUNTER — Ambulatory Visit (INDEPENDENT_AMBULATORY_CARE_PROVIDER_SITE_OTHER): Payer: BC Managed Care – PPO | Admitting: Psychology

## 2018-09-08 DIAGNOSIS — F331 Major depressive disorder, recurrent, moderate: Secondary | ICD-10-CM

## 2018-09-08 DIAGNOSIS — F411 Generalized anxiety disorder: Secondary | ICD-10-CM

## 2018-09-08 NOTE — Progress Notes (Signed)
Virtual Visit via Video Note  I connected with Melanie Meyer on 09/08/18 at  2:30 PM EDT by a video enabled telemedicine application and verified that I am speaking with the correct person using two identifiers.   I discussed the limitations of evaluation and management by telemedicine and the availability of in person appointments. The patient expressed understanding and agreed to proceed.    I discussed the assessment and treatment plan with the patient. The patient was provided an opportunity to ask questions and all were answered. The patient agreed with the plan and demonstrated an understanding of the instructions.   The patient was advised to call back or seek an in-person evaluation if the symptoms worsen or if the condition fails to improve as anticipated.  I provided 50 minutes of non-face-to-face time during this encounter.   [name] [facility]    THERAPIST PROGRESS NOTE  Session Time: 2.30pm-3.20pm  Participation Level: Active  Behavioral Response: Well GroomedAlertaffect wnl  Type of Therapy: Individual Therapy  Treatment Goals addressed: Diagnosis: MDD, GAD and goal 1.  Interventions: CBT and Supportive  Summary: Melanie Meyer is a 23 y.o. female who presents with affect wnl.  Pt reported she cancelled last appointment as was adjusting to working. Pt started at end of June working parttime for [employer] in [place].  Pt reported intially doubted about taking the job worrying that if did would be stuck in this job forever.  Pt reported she was able to reframe and has encouraged herself to stick w/ through the commitment.  Pt reported that overall she likes the job and days go quick.  Pt does feel tired after working. Pt reported that she is experiencing doubts in day to day about whether she is doing the "right thing' even on minor decisions.  Pt aware that sister doubting or questioning because of sister's anxiety is impacting.  Pt recognized  need to challenge self and came up w/ mantra that she is in the drivers seat and that everyone has their own opinions. She also recognized that she can change decisions as needed. Pt reported that feels uncertainty about school and how to safely return and what are the best ways to go about.  Pt shared that so much information and competing thoughts confuses her.   Suicidal/Homicidal: Nowithout intent/plan  Therapist Response: Assessed pt current functioning per pt report. Processed w/pt her anxiety and her self doubt.  Explored w/pt positives w/ her job and reflected positives.  Assisted pt in ways she can challenge self doubt and had pt identify this for self and how to use day to day.  Explored w/ pt transition to school.  Encouraged pt to seek one trusted source for Covid info and tune out the rest.  Plan: Return again in 2 weeks, via webex.  Pt to f/u as scheduled w/ Dr. Doyne Keel.  Diagnosis: MDD, GAD   [name], [facility] 09/08/2018

## 2018-10-07 ENCOUNTER — Other Ambulatory Visit: Payer: Self-pay

## 2018-10-07 ENCOUNTER — Ambulatory Visit (HOSPITAL_COMMUNITY): Payer: BC Managed Care – PPO | Admitting: Psychiatry

## 2018-10-11 ENCOUNTER — Ambulatory Visit (INDEPENDENT_AMBULATORY_CARE_PROVIDER_SITE_OTHER): Payer: BC Managed Care – PPO | Admitting: Psychology

## 2018-10-11 ENCOUNTER — Other Ambulatory Visit: Payer: Self-pay

## 2018-10-11 DIAGNOSIS — F411 Generalized anxiety disorder: Secondary | ICD-10-CM | POA: Diagnosis not present

## 2018-10-11 DIAGNOSIS — F330 Major depressive disorder, recurrent, mild: Secondary | ICD-10-CM

## 2018-10-11 NOTE — Progress Notes (Signed)
Virtual Visit via Video Note  I connected with Melanie Meyer on 10/11/18 at  3:30 PM EDT by a video enabled telemedicine application and verified that I am speaking with the correct person using two identifiers.   I discussed the limitations of evaluation and management by telemedicine and the availability of in person appointments. The patient expressed understanding and agreed to proceed.    I discussed the assessment and treatment plan with the patient. The patient was provided an opportunity to ask questions and all were answered. The patient agreed with the plan and demonstrated an understanding of the instructions.   The patient was advised to call back or seek an in-person evaluation if the symptoms worsen or if the condition fails to improve as anticipated.  I provided 45 minutes of non-face-to-face time during this encounter.   [name] [facility]    THERAPIST PROGRESS NOTE  Session Time: 3.30pm-4.15pm  Participation Level: Active  Behavioral Response: Well GroomedAlertaffect wnl  Type of Therapy: Individual Therapy  Treatment Goals addressed: Diagnosis: GAD, MDD and goal 1.  Interventions: CBT and Strength-based  Summary: Melanie Meyer is a 23 y.o. female who presents with affect wnl.  Pt reported that she is back on campus and the feel of campus is very quiet.  Pt reported started classes last week but between camera not working on laptop and confusion about schedule in person vs. Zoom- didn't attend a lot of class. Pt has worked out things from this week.  Pt reported that she also found out that she missed an email about returning to [employer] job in the spring and now trying to connect w/staff to see if can continue w/ employment now. Pt reported that only worry is will her roommates still be friends w/ her after see how she lives.  Pt reported that differences are emerging- about backgrounds, sleep schedules and level of cleanliness in personal space.  Pt also  recognized that she will have to assert self when comes to saying no to sharing things she has bought for self.  Pt recognized some distortions and how to reframe as well as potential ways of asserting w/ roommates so doesn't become a major problem..   Suicidal/Homicidal: Nowithout intent/plan  Therapist Response: Assessed pt current functioning per pt report. Processed w/pt transition to on campus w/ college.  Explored w/pt her worries w/ being liked or judged by roommates who are friends.  Discussed ways of reframing and asserting self as well.   Plan: Return again in 2 weeks, via webex  Diagnosis: GAD, MDD   [name], [facility] 10/11/2018

## 2018-10-26 ENCOUNTER — Other Ambulatory Visit (HOSPITAL_COMMUNITY): Payer: Self-pay | Admitting: Psychiatry

## 2018-10-26 DIAGNOSIS — F411 Generalized anxiety disorder: Secondary | ICD-10-CM

## 2018-10-26 DIAGNOSIS — F331 Major depressive disorder, recurrent, moderate: Secondary | ICD-10-CM

## 2018-10-28 ENCOUNTER — Other Ambulatory Visit: Payer: Self-pay

## 2018-10-28 ENCOUNTER — Ambulatory Visit (INDEPENDENT_AMBULATORY_CARE_PROVIDER_SITE_OTHER): Payer: BC Managed Care – PPO | Admitting: Psychology

## 2018-10-28 DIAGNOSIS — F331 Major depressive disorder, recurrent, moderate: Secondary | ICD-10-CM | POA: Diagnosis not present

## 2018-10-28 DIAGNOSIS — F411 Generalized anxiety disorder: Secondary | ICD-10-CM | POA: Diagnosis not present

## 2018-10-28 NOTE — Progress Notes (Signed)
Virtual Visit via Video Note  I connected with Melanie Meyer on 10/28/18 at  1:30 PM EDT by a video enabled telemedicine application and verified that I am speaking with the correct person using two identifiers.   I discussed the limitations of evaluation and management by telemedicine and the availability of in person appointments. The patient expressed understanding and agreed to proceed.    I discussed the assessment and treatment plan with the patient. The patient was provided an opportunity to ask questions and all were answered. The patient agreed with the plan and demonstrated an understanding of the instructions.   The patient was advised to call back or seek an in-person evaluation if the symptoms worsen or if the condition fails to improve as anticipated.  I provided 46 minutes of non-face-to-face time during this encounter.   [name] [facility]    THERAPIST PROGRESS NOTE  Session Time: 1.30pm-2.16pm  Participation Level: Active  Behavioral Response: Well GroomedAlertaffect wnl  Type of Therapy: Individual Therapy  Treatment Goals addressed: Diagnosis: GAD, MDD and goal 1.  Interventions: CBT and Supportive  Summary: Melanie Meyer is a 23 y.o. female who presents with affect wnl.  Pt shared that she is not doing well as feels overwhelmed w/ adjusting to school the schedule, zoom vs. In person and balancing w/ work.  Pt reported that she has been missing logging onto zoom classes and confused by what days virtual vs. In person.  Pt reports that she is no liking that not inperson to get feedback from art projects.  Pt reports that she also recognizes that working 6 hour shifts at [employer] is too much to manage.  Pt plans to go in and inform need to turn in notice if unable to modify for 4 hours shifts and she will also look for work on campus- although it's limited.  Pt discussed also feeling that she is overthinking her interactions w/ roommates who are friends and  worried that don't actually like her. Pt is is able to challenge and reframe and focus on how to be present w/ interactions.  Pt also is not waking and utilizing her day before class and increased awareness that only waking hours are w/ things that feel stressful.  Pt agrees to plan for things she can enjoy in her day and give self time for this prior to evening classes..   Suicidal/Homicidal: Nowithout intent/plan  Therapist Response: ASsessed pt current functioning per pt report.  Processed w/pt coping w/ stress and feeling overwhelmed.  Assisted pt w/ focusing on the things that are in her control and creating routine and calendar to assist in organizing self.  Explored w/pt ways of challenging worries and reframing.  Discussed planning to enjoy her day at not just get out of bed for class.    Plan: Return again in 2 weeks, via webex.  F/u as scheduled w/ Dr. Doyne Keel as well.   Diagnosis: GAD, MDD   [name], [facility] 10/28/2018

## 2018-11-09 ENCOUNTER — Other Ambulatory Visit: Payer: Self-pay

## 2018-11-09 ENCOUNTER — Ambulatory Visit (INDEPENDENT_AMBULATORY_CARE_PROVIDER_SITE_OTHER): Payer: BC Managed Care – PPO | Admitting: Psychology

## 2018-11-09 DIAGNOSIS — F411 Generalized anxiety disorder: Secondary | ICD-10-CM

## 2018-11-09 DIAGNOSIS — F331 Major depressive disorder, recurrent, moderate: Secondary | ICD-10-CM | POA: Diagnosis not present

## 2018-11-09 NOTE — Progress Notes (Signed)
Virtual Visit via Video Note  I connected with Melanie Meyer on 11/09/18 at  2:30 PM EDT by a video enabled telemedicine application and verified that I am speaking with the correct person using two identifiers.   I discussed the limitations of evaluation and management by telemedicine and the availability of in person appointments. The patient expressed understanding and agreed to proceed.   I discussed the assessment and treatment plan with the patient. The patient was provided an opportunity to ask questions and all were answered. The patient agreed with the plan and demonstrated an understanding of the instructions.   The patient was advised to call back or seek an in-person evaluation if the symptoms worsen or if the condition fails to improve as anticipated.  I provided 46 minutes of non-face-to-face time during this encounter.   [name] [facility]    THERAPIST PROGRESS NOTE  Session Time: 2.32pm-3.18pm  Participation Level: Active  Behavioral Response: Well GroomedAlertAnxious and Depressed  Type of Therapy: Individual Therapy  Treatment Goals addressed: Diagnosis: GAD, MDD and goal 1  Interventions: CBT, Solution Focused and Strength-based  Summary: Melanie Meyer is a 23 y.o. female who presents with affect wnl.  Pt reported she had a bad week last week- pt reported she felt overwhelmed and just shut down and didn't do any school work.  Pt reported that she was just trying to keep from panic attack.  Pt reported she is working w/ school disability services and does have accommodations and has been talking w/ them.  Pt reported that she has she has reached out to one teacher and awaiting response.  Pt has been trying to get courage to reach out to other teachers as well.  Pt receptive to using the same email for each teacher and awareness that once done can then focus on resolving.  Pt discussed that she did talk w/ team lead re: giving notice.  Pt reported he asked  for 2 weeks to find replacement.  Pt is aware that has been 2 weeks and may have to assert for self care.  Pt reported she has been getting more down so far this week and reframing negative thoughts.  Pt also increased awareness of self kindness re: last week. Pt wants to talk w/ psychiatrist at appointment this week about struggles w/ focus.     Suicidal/Homicidal: Nowithout intent/plan  Therapist Response: Assessed pt current functioning per pt report. Processed w/pt shut down last week and how new week- being kind to self- not putting self down for last week and taking steps this week w/ things she can address w/ supports.  Discussed ways to make easier to assert and express self- reusing same email- putting in writing her notice for work.    Plan: Return again in 2 weeks, via webex.  F/u as scheduled w/ Dr. Doyne Keel this week.   Diagnosis: GAD, MDD   [name], [facility] 11/09/2018

## 2018-11-11 ENCOUNTER — Encounter (HOSPITAL_COMMUNITY): Payer: Self-pay | Admitting: Psychiatry

## 2018-11-11 ENCOUNTER — Other Ambulatory Visit: Payer: Self-pay

## 2018-11-11 ENCOUNTER — Ambulatory Visit (INDEPENDENT_AMBULATORY_CARE_PROVIDER_SITE_OTHER): Payer: BC Managed Care – PPO | Admitting: Psychiatry

## 2018-11-11 DIAGNOSIS — F411 Generalized anxiety disorder: Secondary | ICD-10-CM | POA: Diagnosis not present

## 2018-11-11 DIAGNOSIS — F331 Major depressive disorder, recurrent, moderate: Secondary | ICD-10-CM

## 2018-11-11 MED ORDER — ESCITALOPRAM OXALATE 20 MG PO TABS: 20.0000 mg | ORAL_TABLET | Freq: Every day | ORAL | 0 refills | Status: DC

## 2018-11-11 MED ORDER — BUSPIRONE HCL 10 MG PO TABS: 10.0000 mg | ORAL_TABLET | Freq: Two times a day (BID) | ORAL | 0 refills | Status: DC

## 2018-11-11 NOTE — Progress Notes (Signed)
  Virtual Visit via Telephone Note  I connected with Melanie Meyer  on 11/11/18 at 11:45 AM EDT by telephone and verified that I am speaking with the correct person using two identifiers.  Location: Patient: at home Provider: office   I discussed the limitations, risks, security and privacy concerns of performing an evaluation and management service by telephone and the availability of in person appointments. I also discussed with the patient that there may be a patient responsible charge related to this service. The patient expressed understanding and agreed to proceed.   History of Present Illness: Pt states she is not doing well in school. It is very stressful. She had one full week where she did nothing due to overwhelming anxiety. The result has not been good and she is trying to get support and catch up. She feels the Lexapro and Buspar are helping to decrease her level of her anxiety and depression. With out the meds she knows she would be much worse. She is asking for someting to help with concentration. 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 She needs to be in the classroom setting. She now has difficulty focusing or fidgety. She is distracted by everything.  In the past she always procrastinated her work and did it last minute. She has never taken any meds for ADHD.     Observations/Objective: I spoke with Melanie Meyer on the phone.  Pt was calm, pleasant and cooperative.  Pt was engaged in the conversation and answered questions appropriately.  Speech was clear and coherent with normal rate, tone and volume.  Mood is anxious, affect is congruent. Thought processes are coherent, goal oriented and intact.  Thought content is with ruminations.   Pt denies SI/HI.   Pt denies auditory and visual hallucinations and did not appear to be responding to internal stimuli.  Memory and concentration are good.  Fund of knowledge and use of language are average.  Insight and judgment are  fair.  I am unable to comment on psychomotor activity, general appearance, hygiene, or eye contact as I was unable to physically see the patient on the phone.  Vital signs not available since interview conducted virtually.     Assessment and Plan: GAD; MDD- recurrent, mild  Status of current symptoms: ongoing anxiety and depression, poor concentration  Lexapro 20mg  po qD Increase Buspar 10mg  po BID     Follow Up Instructions: In 8 weeks or sooner if needed   I discussed the assessment and treatment plan with the patient. The patient was provided an opportunity to ask questions and all were answered. The patient agreed with the plan and demonstrated an understanding of the instructions.   The patient was advised to call back or seek an in-person evaluation if the symptoms worsen or if the condition fails to improve as anticipated.  I provided 20 minutes of non-face-to-face time during this encounter.   Charlcie Cradle, MD

## 2018-11-23 ENCOUNTER — Ambulatory Visit (INDEPENDENT_AMBULATORY_CARE_PROVIDER_SITE_OTHER): Payer: BC Managed Care – PPO | Admitting: Psychology

## 2018-11-23 ENCOUNTER — Other Ambulatory Visit: Payer: Self-pay

## 2018-11-23 DIAGNOSIS — F331 Major depressive disorder, recurrent, moderate: Secondary | ICD-10-CM | POA: Diagnosis not present

## 2018-11-23 DIAGNOSIS — F411 Generalized anxiety disorder: Secondary | ICD-10-CM

## 2018-11-23 NOTE — Progress Notes (Signed)
Virtual Visit via Video Note  I connected with Melanie Meyer on 11/23/18 at  1:30 PM EDT by a video enabled telemedicine application and verified that I am speaking with the correct person using two identifiers.   I discussed the limitations of evaluation and management by telemedicine and the availability of in person appointments. The patient expressed understanding and agreed to proceed.     I discussed the assessment and treatment plan with the patient. The patient was provided an opportunity to ask questions and all were answered. The patient agreed with the plan and demonstrated an understanding of the instructions.   The patient was advised to call back or seek an in-person evaluation if the symptoms worsen or if the condition fails to improve as anticipated.  I provided 46 minutes of non-face-to-face time during this encounter.   [name] [facility]    THERAPIST PROGRESS NOTE  Session Time: 1.30pm-2.16pm  Participation Level: Active  Behavioral Response: Well GroomedAlertaffect wnl  Type of Therapy: Individual Therapy  Treatment Goals addressed: Diagnosis: MDD, GAD and goal 1.  Interventions: CBT and Strength-based  Summary: Melanie Meyer is a 23 y.o. female who presents with affect wnl.  Pt reported that she is doing better than last visit.  Pt reported that she was able to get accomdations through campus services and reached out to teachers.  One teacher thought she was too far behind to get caught up and suggested dropping that art class and feels good about doing- recognized wasn't good match the format and teacher.  Pt reported that math teacher is working w/ her and she is making progress w/ that this week.  Pt reported that she also quit [employer] that weekend and w/ parents encouragement.   Pt reported that gave relief.  Pt reports that feels that medication is helping some as well.  Pt reported that she still finds a struggle to self motivate and self  discipline but that this has always been difficult.  Pt reported that she is trying to set up things to be reminders and not to put self down as can be easy to do.  Pt was able to reframe for self in session negative self statement patterns.   Suicidal/Homicidal: Nowithout intent/plan  Therapist Response: Assessed pt current functioning per pt report.  Processed w/ pt coping w/ stressors of school and things that have assisted her in making progress.  Explored w/pt her self talk and ways this can get in the way as barrier or discouraging.  assisted pt in recognizing and reframing.   Plan: Return again in 2 weeks, via webex.  F/u as scheduled w/ dr. Doyne Keel..  Diagnosis: MDD, GAD   [name], [facility] 11/23/2018

## 2018-12-13 ENCOUNTER — Ambulatory Visit (INDEPENDENT_AMBULATORY_CARE_PROVIDER_SITE_OTHER): Payer: BC Managed Care – PPO | Admitting: Psychology

## 2018-12-13 ENCOUNTER — Other Ambulatory Visit: Payer: Self-pay

## 2018-12-13 DIAGNOSIS — F331 Major depressive disorder, recurrent, moderate: Secondary | ICD-10-CM

## 2018-12-13 DIAGNOSIS — F411 Generalized anxiety disorder: Secondary | ICD-10-CM | POA: Diagnosis not present

## 2018-12-13 NOTE — Progress Notes (Signed)
Virtual Visit via Telephone Note  I connected with Melanie Meyer on 12/13/18 at 12:30 PM EDT by telephone, as video not working, and verified that I am speaking with the correct person using two identifiers.   I discussed the limitations, risks, security and privacy concerns of performing an evaluation and management service by telephone and the availability of in person appointments. I also discussed with the patient that there may be a patient responsible charge related to this service. The patient expressed understanding and agreed to proceed.    I discussed the assessment and treatment plan with the patient. The patient was provided an opportunity to ask questions and all were answered. The patient agreed with the plan and demonstrated an understanding of the instructions.   The patient was advised to call back or seek an in-person evaluation if the symptoms worsen or if the condition fails to improve as anticipated.  I provided 46 minutes of non-face-to-face time during this encounter.   [name] [facility]    THERAPIST PROGRESS NOTE  Session Time: 12.30pm-1.16pm  Participation Level: Active  Behavioral Response: n/a as no videoAlertaffect wnl  Type of Therapy: Individual Therapy  Treatment Goals addressed: Diagnosis: GAD, MDD and goal 1.  Interventions: CBT and Supportive  Summary: Melanie Meyer is a 23 y.o. female who presents with affect congruent w/ report of depressed/anxious.  Pt reported that she had a hectic week as went to campus health due to sore throat and was tested for covid- which was negative- but had to leave campus and quarantine until results.  Pt reported that she has been struggling as well w/ interactions w/ roommates.  Pt reports that she has withdrawn from roommates more as at times feels like not connecting or is annoyed by some things they say.  Pt at same point wants to feel connected to these friends and not be rejected.  Pt reports there  haven't been any negative interactions or poor intentions on their part.  Pt increased awareness that her withdrawing further keeps from connecting.  Pt is able to express that when they joke or casual talk about life being so horrible about minor things feels that minimizes what she experiences w/ depression and anxiety.  Pt however doesn't share this or express this w/ roommates as well. p t increased awareness of ways she can increase her interaction and voice things towards being more assertive and focusing on connecting.  Pt reports she is .   Suicidal/Homicidal: Nowithout intent/plan  Therapist Response: Assessed pt current functioning per pt report. Processed w/pt coping w/ increased anxiety re: interactions w/ her roommates and how is leading to avoidance.  Explored w/pt next steps towards increasing interactions and being intentional about not avoiding.  Plan: Return again in 2 weeks, via webex.  Diagnosis: MDD, GAD  [name] [facility] 12/13/2018

## 2018-12-27 ENCOUNTER — Other Ambulatory Visit: Payer: Self-pay

## 2018-12-27 ENCOUNTER — Ambulatory Visit (INDEPENDENT_AMBULATORY_CARE_PROVIDER_SITE_OTHER): Payer: BC Managed Care – PPO | Admitting: Psychology

## 2018-12-27 DIAGNOSIS — F411 Generalized anxiety disorder: Secondary | ICD-10-CM | POA: Diagnosis not present

## 2018-12-27 DIAGNOSIS — F332 Major depressive disorder, recurrent severe without psychotic features: Secondary | ICD-10-CM | POA: Diagnosis not present

## 2018-12-27 NOTE — Progress Notes (Signed)
Virtual Visit via Telephone Note  I connected with Melanie Meyer on 12/27/18 at  2:30 PM EST by telephone and verified that I am speaking with the correct person using two identifiers.   I discussed the limitations, risks, security and privacy concerns of performing an evaluation and management service by telephone and the availability of in person appointments. I also discussed with the patient that there may be a patient responsible charge related to this service. The patient expressed understanding and agreed to proceed.    I discussed the assessment and treatment plan with the patient. The patient was provided an opportunity to ask questions and all were answered. The patient agreed with the plan and demonstrated an understanding of the instructions.   The patient was advised to call back or seek an in-person evaluation if the symptoms worsen or if the condition fails to improve as anticipated.  I provided 48 minutes of non-face-to-face time during this encounter.   [name] [facility]    THERAPIST PROGRESS NOTE  Session Time: 2.30pm-3.18pm  Participation Level: Active  Behavioral Response: NA and over the phoneAlertAnxious and Depressed  Type of Therapy: Individual Therapy  Treatment Goals addressed: Diagnosis: MDD, GAD and goal 1.  Interventions: CBT, Strength-based and Supportive  Summary: Melanie Meyer is a 23 y.o. female who presents with affect congruent w/ report of depression and anxiety.  Pt reported that some things have been positive recently but also some struggles.  Pt reported that she did talk w/ her roommates/friends as discussed last session and was positive. They were receptive and acknowledged they had noticed something going on.  Pt reported that she has been spending more time w/ and enjoyed halloween plans w/ them.  Pt reported that last week received a notification that in jeopardy of failing j[place] class and this was very discouraging as had  been really working to bring up the class.  Pt reported that reached out to advising as was suggested and learned that could file for medical withdraw given the mental health struggles.  Pt reported that this was good to hear but also still worries that wouldn't be approved and was having doubts for self and feeling worthless and what was the point.  Pt admitted last week SI- no intent, no plan and was able to reach out to sister and this was helpful.  Pt reported no SI since.  Pt reported she has been getting out of bed, focusing on relaxing and finding things to engage and not stay in negatives.  Pt is worried about informing her parents- but is able to reframe and aware that they will be supportive and understanding.    Suicidal/Homicidal: Nowithout intent/plan  Therapist Response: Assessed pt current functioning per pt report.  Processed w/pt positives and ability to no avoid and assert self- reflecting this went well.  Discussed w/pt stressors and processed w/pt SI.  Reflected good use of supports and discussed pt plan for continued coping w/ engagement and use of supports.   Plan: Return again in 2 weeks, via webex.  Pt to f/u as scheduled w/ Dr. Doyne Keel this week.  Pt to call/seek crisis services if further SI or worsening symptoms.  Diagnosis: MDD, GAD  [name] [facility] 12/27/2018

## 2018-12-30 ENCOUNTER — Ambulatory Visit (INDEPENDENT_AMBULATORY_CARE_PROVIDER_SITE_OTHER): Payer: BC Managed Care – PPO | Admitting: Psychiatry

## 2018-12-30 ENCOUNTER — Encounter (HOSPITAL_COMMUNITY): Payer: Self-pay | Admitting: Psychiatry

## 2018-12-30 ENCOUNTER — Other Ambulatory Visit: Payer: Self-pay

## 2018-12-30 DIAGNOSIS — F331 Major depressive disorder, recurrent, moderate: Secondary | ICD-10-CM | POA: Diagnosis not present

## 2018-12-30 DIAGNOSIS — F411 Generalized anxiety disorder: Secondary | ICD-10-CM | POA: Diagnosis not present

## 2018-12-30 MED ORDER — ESCITALOPRAM OXALATE 20 MG PO TABS: 20.0000 mg | ORAL_TABLET | Freq: Every day | ORAL | 0 refills | Status: DC

## 2018-12-30 MED ORDER — BUSPIRONE HCL 10 MG PO TABS: 10.0000 mg | ORAL_TABLET | Freq: Two times a day (BID) | ORAL | 0 refills | Status: DC

## 2018-12-30 NOTE — Progress Notes (Signed)
Virtual Visit via Video Note  I connected with Melanie Meyer on 12/30/18 at 11:30 AM EST by a PHONE enabled telemedicine application and verified that I am speaking with the correct person using two identifiers. We were unable to connect by video so opted to continue by phone  Location: Patient: home Provider: office   I discussed the limitations of evaluation and management by telemedicine and the availability of in person appointments. The patient expressed understanding and agreed to proceed.  History of Present Illness: Pt reports she is not doing well in school. Online classes are not going well. She is just waiting for the semester to end so she can go home. It is hard to accept that she is not doing well because she was always a good student in the past. At this point she is just ready to give up on this semester. No matter what she tries it is not working. Her motivation is very low. Her anxiety is no longer a big issue because she is tired and doesn't know what to do. She was depressed a couple of weeks ago. At one one point she had some passing SI. She talked thru it with her sister. Melanie Meyer feels down because she is not doing well in school. She is trying to stay busy and not focus on it. When she compares her grades to her friends it makes her feel bad. She denies SI/HI in the last 2 weeks. She feels the Buspar and Lexapro help a lot.   Observations/Objective: I spoke with Melanie Meyer on the phone.  Pt was calm, pleasant and cooperative.  Pt was engaged in the conversation and answered questions appropriately.  Speech was clear and coherent with normal rate, tone and volume.  Mood is euthymic and affect is full. Thought processes are coherent, goal oriented and intact.  Thought content is with ruminations.  Pt denies SI/HI.   Pt denies auditory and visual hallucinations and did not appear to be responding to internal stimuli.  Memory and concentration are good.  Fund of  knowledge and use of language are average.  Insight and judgment are fair.  I am unable to comment on psychomotor activity, general appearance, hygiene, or eye contact as I was unable to physically see the patient on the phone.  Vital signs not available since interview conducted virtually.    Assessment and Plan: GAD; MDD-recurrent, mild  Status of symptoms: ongoing depression and anxiety  Lexapro 20mg  po qD Buspar 10mg  po BID   Follow Up Instructions: In 2 months or sooner if needed   I discussed the assessment and treatment plan with the patient. The patient was provided an opportunity to ask questions and all were answered. The patient agreed with the plan and demonstrated an understanding of the instructions.   The patient was advised to call back or seek an in-person evaluation if the symptoms worsen or if the condition fails to improve as anticipated.  I provided 20 minutes of non-face-to-face time during this encounter.   Charlcie Cradle, MD

## 2019-01-10 ENCOUNTER — Ambulatory Visit (INDEPENDENT_AMBULATORY_CARE_PROVIDER_SITE_OTHER): Payer: BC Managed Care – PPO | Admitting: Psychology

## 2019-01-10 ENCOUNTER — Other Ambulatory Visit: Payer: Self-pay

## 2019-01-10 DIAGNOSIS — F411 Generalized anxiety disorder: Secondary | ICD-10-CM

## 2019-01-10 DIAGNOSIS — F331 Major depressive disorder, recurrent, moderate: Secondary | ICD-10-CM | POA: Diagnosis not present

## 2019-01-10 NOTE — Progress Notes (Signed)
Virtual Visit via Video Note  I connected with Aniston Christman on 01/10/19 at  2:30 PM EST by a video enabled telemedicine application and verified that I am speaking with the correct person using two identifiers.   I discussed the limitations of evaluation and management by telemedicine and the availability of in person appointments. The patient expressed understanding and agreed to proceed.    I discussed the assessment and treatment plan with the patient. The patient was provided an opportunity to ask questions and all were answered. The patient agreed with the plan and demonstrated an understanding of the instructions.   The patient was advised to call back or seek an in-person evaluation if the symptoms worsen or if the condition fails to improve as anticipated.  I provided 45 minutes of non-face-to-face time during this encounter.   [name] [facility]    THERAPIST PROGRESS NOTE  Session Time: 2.31pm-3.16pm  Participation Level: Active  Behavioral Response: Well GroomedAlertaffect wnl  Type of Therapy: Individual Therapy  Treatment Goals addressed: Diagnosis: MDD< GAD and goal 1.  Interventions: CBT and Supportive  Summary: Paije Goodhart is a 23 y.o. female who presents with affect wnl.  Pt affect is brighter than last session.  Pt reported she returned home over a week ago when she recognized not doing anything staying on campus.  Pt reported she talked w/ parents about the semester and option for withdrawal from semester and they were supportive.  Pt reported that very understanding and that attempted to convince her that just a hiccup in life, pt questions as has thoughts that "this always happens".  Pt was able to challenge this some w/ counselor assistance.  Pt reported that she is enjoying being home and focusing on enjoying this.  Pt reported that she is reminder self to not get to focused on how won't be graduating w/ peers started college w/. Pt reported that  she is trying to make the best out of holiday w/ changes presented by pandemic- not going to visit grandmother as usual and not sure what christmas will look like and change in things that traditions- going to movies, community events w/ friends, etc.  Pt was able to identify and plan for other ways of connecting and plans she is making to stay connect to friends over the winter break.  Pt reported that she is meeting w/ advisor at school to discuss best option and walk through process.  Pt requested a family session to be able to discuss a plan w/ her support system going into spring semester.    Suicidal/Homicidal: Nowithout intent/plan  Therapist Response: Assessed pt current functionign epr pt report. Processed w/pt thoughts re: withdraw from semester, transition home and changes w/ holidays w/ pandemic.  Explored w/pt ways of staying present and focused on connections and ways of connecting and engaging.  Assisted pt w/ refaming distortions.   Plan: Return again in 2 weeks, via webex for individual session that week and later in the week a family session.  F/u as scheduled w/ Dr. Doyne Keel.   Diagnosis: MDD, GAD    [name], [facility] 01/10/2019

## 2019-01-24 ENCOUNTER — Ambulatory Visit (INDEPENDENT_AMBULATORY_CARE_PROVIDER_SITE_OTHER): Payer: BC Managed Care – PPO | Admitting: Psychology

## 2019-01-24 ENCOUNTER — Other Ambulatory Visit: Payer: Self-pay

## 2019-01-24 DIAGNOSIS — F331 Major depressive disorder, recurrent, moderate: Secondary | ICD-10-CM

## 2019-01-24 DIAGNOSIS — F411 Generalized anxiety disorder: Secondary | ICD-10-CM

## 2019-01-24 NOTE — Progress Notes (Addendum)
Virtual Visit via Telephone Note  I connected with Melanie Meyer on 01/24/19 at  3:30 PM EST by telephone and verified that I am speaking with the correct person using two identifiers.   I discussed the limitations, risks, security and privacy concerns of performing an evaluation and management service by telephone and the availability of in person appointments. I also discussed with the patient that there may be a patient responsible charge related to this service. The patient expressed understanding and agreed to proceed.    I discussed the assessment and treatment plan with the patient. The patient was provided an opportunity to ask questions and all were answered. The patient agreed with the plan and demonstrated an understanding of the instructions.   The patient was advised to call back or seek an in-person evaluation if the symptoms worsen or if the condition fails to improve as anticipated.  I provided 58 minutes of non-face-to-face time during this encounter.   [name] [facility]    THERAPIST PROGRESS NOTE  Session Time: 3.32pm-4.30pm  Participation Level: Active  Behavioral Response: Well GroomedAlertaffect wnl  Type of Therapy: Individual Therapy  Treatment Goals addressed: Diagnosis: GAD, MDD and goal 1.  Interventions: CBT and Supportive  Summary: Melanie Meyer is a 23 y.o. female who presents with affect wnl.  Pt reported that she has only been up about 1 hour.  Pt reported that she has been going to bed around 4am- often time losing track of time w/ what she is doing at night.  Pt reported that she did get optioins from school and is choosing the process of course withdrawal. Pt reported that she would like to complete this soon and not put off, but has been difficult w/ facing it to explain the semester and put into writing for someone to review.  Pt reported she she has been struggling w/ feeling that she has failed and that things will work out.  Pt  reported that mom has been trying to encourage her and assist her.  Pt reported that she stays in contact w/ friends but no opportunities for face to face.  Pt discussed that at times talking w/ friends can feel invalidating.  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  Pt increased awareness of how executive functioning can be impacted by a lot of things and that could further explore w/ psychological testing.  Pt discussed how she has struggled w/ focus, planning, organizing, making decisions for a long time.    Suicidal/Homicidal: Nowithout intent/plan  Therapist Response: Assessed pt  Current functioning per pt report. Processed w/pt coping w/ mood and thoughts of failure. Assisted pt in challenging distortions and reframing.  Discussed w/pt interactions w/ family and friends.  Discussed potential of referring for psychological testing to explore or r/o  for ADHD or better understanding of problems pt expresses w/ executive functioning. Plan: Return again in 1 weeks, via webex  for family session.  Pt reported this would be w/ her father only.  Diagnosis: MDD, GAD  [name] [facility] 01/24/2019

## 2019-01-27 ENCOUNTER — Other Ambulatory Visit: Payer: Self-pay

## 2019-01-27 ENCOUNTER — Ambulatory Visit (INDEPENDENT_AMBULATORY_CARE_PROVIDER_SITE_OTHER): Payer: BC Managed Care – PPO | Admitting: Psychology

## 2019-01-27 DIAGNOSIS — F411 Generalized anxiety disorder: Secondary | ICD-10-CM

## 2019-01-27 DIAGNOSIS — F331 Major depressive disorder, recurrent, moderate: Secondary | ICD-10-CM | POA: Diagnosis not present

## 2019-01-27 NOTE — Progress Notes (Signed)
Virtual Visit via Video Note  I connected with Melanie Meyer on 01/27/19 at 11:00 AM EST by a video enabled telemedicine application and verified that I am speaking with the correct person using two identifiers.   I discussed the limitations of evaluation and management by telemedicine and the availability of in person appointments. The patient expressed understanding and agreed to proceed.    I discussed the assessment and treatment plan with the patient. The patient was provided an opportunity to ask questions and all were answered. The patient agreed with the plan and demonstrated an understanding of the instructions.   The patient was advised to call back or seek an in-person evaluation if the symptoms worsen or if the condition fails to improve as anticipated.  I provided 60 minutes of non-face-to-face time during this encounter.   [name] [facility]    THERAPIST PROGRESS NOTE  Session Time: 11.01am-12.01pm  Participation Level: Active  Behavioral Response: Well GroomedAlertaffect wnl  Type of Therapy: Family Therapy  Treatment Goals addressed: Diagnosis: GAD, MDD and goal 1.  Interventions: CBT, Solution Focused and Supportive  Summary: Melanie Meyer is a 23 y.o. female who presents with her father for family session to support planning for spring semester.  Pt reported that she is completing w/ mom the written part of her withdrew request.  Pt reported she may need a letter or documentation from the office to assist.  Pt reported she will see what is needed and email counselor.  Pt and dad discussed referral for psychological testing for r/o or clarify executive functioning problems related to  ADHD vs. Depression/anxiety.  Dad inquired about academic coach and thoughts on that.  Pt reported that wished this was resource through campus.  Pt and dad discussed whether semester off until return to [place] learning would be beneficial. Pt doesn't want to wait and  prefers to continue w/ attending next semester.  Pt, dad and counselor discussed campus resources and finding out more information to fully utilize.  Pt agreed need to seeking help when first needed and not avoid. Suicidal/Homicidal: Nowithout intent/plan  Therapist Response: Assessed pt current functioning per pt ad parent report.  Facilitated communication to discuss barriers experiencing to academic functioning.  Further exploring w/ psychological testing- provided referrals.  Raised question of taking semester off if not option for inperson classes.  Discussed utilizing campus resources and researching what other things offered. Encouraged pt to not avoid seeking assistance as needs next semester, instead of avoidance.  Plan: Return again in 2 weeks, via webex.  Pt to f/u as scheduled w/ Dr. Doyne Keel.   Diagnosis: MDD, GAD- r/o ADHD   [name], [facility] 01/27/2019

## 2019-02-02 ENCOUNTER — Encounter (HOSPITAL_COMMUNITY): Payer: Self-pay | Admitting: Psychology

## 2019-02-07 ENCOUNTER — Ambulatory Visit (INDEPENDENT_AMBULATORY_CARE_PROVIDER_SITE_OTHER): Payer: BC Managed Care – PPO | Admitting: Psychology

## 2019-02-07 ENCOUNTER — Other Ambulatory Visit: Payer: Self-pay

## 2019-02-07 DIAGNOSIS — F411 Generalized anxiety disorder: Secondary | ICD-10-CM | POA: Diagnosis not present

## 2019-02-07 DIAGNOSIS — F331 Major depressive disorder, recurrent, moderate: Secondary | ICD-10-CM

## 2019-02-07 NOTE — Progress Notes (Signed)
Virtual Visit via Video Note  I connected with Melanie Meyer on 02/07/19 at  2:30 PM EST by a video enabled telemedicine application and verified that I am speaking with the correct person using two identifiers.   I discussed the limitations of evaluation and management by telemedicine and the availability of in person appointments. The patient expressed understanding and agreed to proceed.    I discussed the assessment and treatment plan with the patient. The patient was provided an opportunity to ask questions and all were answered. The patient agreed with the plan and demonstrated an understanding of the instructions.   The patient was advised to call back or seek an in-person evaluation if the symptoms worsen or if the condition fails to improve as anticipated.  I provided 53 minutes of non-face-to-face time during this encounter.   [name] [facility]    THERAPIST PROGRESS NOTE  Session Time: 2.30pm-3.23pm  Participation Level: Active  Behavioral Response: Well GroomedAlertworry and depression  Type of Therapy: Individual Therapy  Treatment Goals addressed: Diagnosis: MDD, GAD and goal 1.  Interventions: CBT and Supportive  Summary: Melanie Meyer is a 23 y.o. female who presents with affect congruent w/ report of worry and stress of process for academic withdrawal and financial aid appeal.  Pt reported that last week just found out that financial aid stopped due to grades last semester and now in process of appeal for that.  Pt reported was just another stressor and pt lacking motivation- feels that has given up. pt reported parents are pushing along.  Pt waiting for one piece for advisor to submit appeal.  Pt reported that she is aware that tends to be negative and that hard for her to align w/ mom and how she feels hopeful for better outcome next semester.  Pt also discussed that difficult for her to predict how things will turn out and whether to try for next  semester.  Pt did report on some positives w/ preparing for Christmas and Lorenza Chick with her family.   Suicidal/Homicidal: Nowithout intent/plan  Therapist Response: Assessed pt current functioning per pt report. Processed w/pt her stress and worry about appeal process for next semester financial aid.  Explored w/pt pt sense of readiness for next semester.  Processed w/pt plans towards beginning next semester and not defining success for failure by whether attends or outcomes.  Plan: Return again in 2 weeks, via webex.  Pt f/u w/ Dr. Doyne Keel as scheduled.   Diagnosis: GAD,  MDD  [name], [facility] 02/07/2019

## 2019-02-23 ENCOUNTER — Ambulatory Visit (HOSPITAL_COMMUNITY): Payer: BC Managed Care – PPO | Admitting: Psychology

## 2019-03-02 ENCOUNTER — Other Ambulatory Visit: Payer: Self-pay

## 2019-03-02 ENCOUNTER — Ambulatory Visit (INDEPENDENT_AMBULATORY_CARE_PROVIDER_SITE_OTHER): Payer: BC Managed Care – PPO | Admitting: Psychology

## 2019-03-02 DIAGNOSIS — F331 Major depressive disorder, recurrent, moderate: Secondary | ICD-10-CM | POA: Diagnosis not present

## 2019-03-02 DIAGNOSIS — F411 Generalized anxiety disorder: Secondary | ICD-10-CM | POA: Diagnosis not present

## 2019-03-02 NOTE — Progress Notes (Signed)
Virtual Visit via Video Note  I connected with Melanie Meyer on 03/02/19 at  2:30 PM EST by a video enabled telemedicine application and verified that I am speaking with the correct person using two identifiers.   I discussed the limitations of evaluation and management by telemedicine and the availability of in person appointments. The patient expressed understanding and agreed to proceed.    I discussed the assessment and treatment plan with the patient. The patient was provided an opportunity to ask questions and all were answered. The patient agreed with the plan and demonstrated an understanding of the instructions.   The patient was advised to call back or seek an in-person evaluation if the symptoms worsen or if the condition fails to improve as anticipated.  I provided 50 minutes of non-face-to-face time during this encounter.   Forde Radon [facility]    THERAPIST PROGRESS NOTE  Session Time: 2.30pm-3.20pm  Participation Level: Active  Behavioral Response: Well GroomedAlertAnxious and Depressed  Type of Therapy: Individual Therapy  Treatment Goals addressed: Diagnosis: MDD, GAD and goal 1.  Interventions: Supportive  Summary: Melanie Meyer is a 24 y.o. female who presents with affect congruent w/ report of recent stress.  pt informed that her appeal for financial aid was denied.  Pt reported that she just found this out yesterday and has been trying to come to terms w/.  Pt reported that had anticipated would be approved w/ everything she had been told.  Pt reported now can't live on campus, knows that can't afford full tuition w/out aid and trying to figure out next steps.  pt reports parents are being very supportive.  Pt reported that she was trying to understand why crying about yesterday as assumes most would just accept and move forward.  Pt was able to challenge this and validate and normalize her feelings.    Suicidal/Homicidal: Nowithout  intent/plan  Therapist Response: Assessed pt current functioning per pt report. Processed w/pt coping w/ appeal being denied.  Assisted pt in validating and normalizing being disappointed and sad about this.  Discussed acceptance doesn't mean has to like/agree.  Encouraged pt to utilize her supports at home and w/ school to gather information and options to make informed decision about this semester.  Plan: Return again in 1 week, via webex.  Pt to f/u as scheduled w/ Dr Michae Kava. .  Diagnosis: MDD, GAD   Forde Radon [facility] 03/02/2019

## 2019-03-09 ENCOUNTER — Encounter (HOSPITAL_COMMUNITY): Payer: Self-pay | Admitting: Psychology

## 2019-03-09 ENCOUNTER — Ambulatory Visit (HOSPITAL_COMMUNITY): Payer: BC Managed Care – PPO | Admitting: Psychology

## 2019-03-09 ENCOUNTER — Other Ambulatory Visit: Payer: Self-pay

## 2019-03-09 NOTE — Progress Notes (Signed)
Melanie Meyer is a 24 y.o. female patient who didn't show for her virtual appointment.  Counselor attempted to reach by email and informed of missed appointment and next appointment scheduled for f/u.Marland Kitchen        Forde Radon, [facility]

## 2019-03-10 ENCOUNTER — Ambulatory Visit (HOSPITAL_COMMUNITY): Payer: BC Managed Care – PPO | Admitting: Psychiatry

## 2019-03-12 ENCOUNTER — Emergency Department
Admission: EM | Admit: 2019-03-12 | Discharge: 2019-03-12 | Disposition: A | Discharge status: Home / Self Care | Payer: BC Managed Care – PPO | Source: Home / Self Care | Attending: Emergency Medicine | Admitting: Emergency Medicine

## 2019-03-12 ENCOUNTER — Encounter: Payer: Self-pay | Admitting: Emergency Medicine

## 2019-03-12 ENCOUNTER — Other Ambulatory Visit: Payer: Self-pay

## 2019-03-12 DIAGNOSIS — H9202 Otalgia, left ear: Secondary | ICD-10-CM | POA: Diagnosis not present

## 2019-03-12 DIAGNOSIS — M26622 Arthralgia of left temporomandibular joint: Secondary | ICD-10-CM | POA: Diagnosis not present

## 2019-03-12 DIAGNOSIS — H60502 Unspecified acute noninfective otitis externa, left ear: Secondary | ICD-10-CM

## 2019-03-12 MED ORDER — CYCLOBENZAPRINE HCL 10 MG PO TABS: 10.0000 mg | ORAL_TABLET | Freq: Three times a day (TID) | ORAL | 0 refills | Status: DC | PRN

## 2019-03-12 MED ORDER — IBUPROFEN 600 MG PO TABS: 600.0000 mg | ORAL_TABLET | Freq: Four times a day (QID) | ORAL | 0 refills | Status: DC | PRN

## 2019-03-12 MED ORDER — CIPROFLOXACIN-DEXAMETHASONE 0.3-0.1 % OT SUSP: 4.0000 [drp] | Freq: Two times a day (BID) | OTIC | 0 refills | Status: DC

## 2019-03-12 NOTE — Discharge Instructions (Addendum)
I suspect that this could be an external ear infection and your temporomandibular joint, however this could also be early shingles.  Return here or see your doctor if you break out in a blistery rash and we can change therapy.  In the meantime 600 mg ibuprofen combined with 1 g of Tylenol 3-4 times a day as needed, soft diet, Flexeril as needed, but definitely take it at night, eardrops as prescribed.

## 2019-03-12 NOTE — ED Provider Notes (Signed)
HPI  SUBJECTIVE:  Melanie Meyer is a 24 y.o. female who presents with 4 to 5 days of left ear pain that radiates to her left neck.  It is sharp, intermittent, lasting seconds.  She reports decreased hearing.  No fevers, nasal congestion, otorrhea.  She wears ear buds but denies other foreign body insertion.  She states that she grinds her teeth at night.  No facial rash, facial pain, sore throat, new rash on her neck.  No recent antibiotics or antipyretics in the past 4 to 6 hours.  She tried 1000 mg of Tylenol once and a heating pad with improvement in her symptoms.  Symptoms are worse with yawning.  She has a past medical history of cerumen impaction.  No history of frequent otitis media, shingles, chickenpox, diabetes, TMJ arthralgia.  LMP: Last month.  Denies possibility of being pregnant.  PMD: Unable to remember name.  Past Medical History:  Diagnosis Date  . Anxiety   . Depression   . Frequent headaches     History reviewed. No pertinent surgical history.  Family History  Problem Relation Age of Onset  . Depression Sister   . Anxiety disorder Sister   . Depression Brother   . Anxiety disorder Brother     Social History   Tobacco Use  . Smoking status: Never Smoker  . Smokeless tobacco: Never Used  Substance Use Topics  . Alcohol use: No  . Drug use: No    No current facility-administered medications for this encounter.  Current Outpatient Medications:  .  busPIRone (BUSPAR) 10 MG tablet, Take 1 tablet (10 mg total) by mouth 2 (two) times daily., Disp: 180 tablet, Rfl: 0 .  ciprofloxacin-dexamethasone (CIPRODEX) OTIC suspension, Place 4 drops into the left ear 2 (two) times daily. X 7 days, Disp: 7.5 mL, Rfl: 0 .  cyclobenzaprine (FLEXERIL) 10 MG tablet, Take 1 tablet (10 mg total) by mouth 3 (three) times daily as needed for muscle spasms., Disp: 20 tablet, Rfl: 0 .  EPIDUO FORTE 0.3-2.5 % GEL, , Disp: , Rfl:  .  escitalopram (LEXAPRO) 20 MG tablet, Take 1 tablet  (20 mg total) by mouth daily., Disp: 90 tablet, Rfl: 0 .  fluticasone (CUTIVATE) 0.05 % cream, APPLY TO AFFECTED AREA EVERY DAY, Disp: , Rfl:  .  ibuprofen (ADVIL) 600 MG tablet, Take 1 tablet (600 mg total) by mouth every 6 (six) hours as needed., Disp: 30 tablet, Rfl: 0 .  norethindrone-ethinyl estradiol (JUNEL FE,GILDESS FE,LOESTRIN FE) 1-20 MG-MCG tablet, Take 1 tablet by mouth daily., Disp: , Rfl:  .  rizatriptan (MAXALT) 5 MG tablet, TAKE 1 TABLET (5 MG TOTAL) BY MOUTH AS NEEDED FOR MIGRAINE. MAY REPEAT IN 2 HOURS IF NEEDED, Disp: , Rfl:  .  tacrolimus (PROTOPIC) 0.03 % ointment, APPLY TO AFFECTED AREA TWICE A DAY, Disp: , Rfl:  .  triamcinolone cream (KENALOG) 0.1 %, Apply to atopic dermatitis on ears and neck 1-2 times a day as needed, Disp: , Rfl:   No Known Allergies   ROS  As noted in HPI.   Physical Exam  BP 111/67 (BP Location: Right Arm)   Pulse 78   Temp 98.8 F (37.1 C) (Oral)   Resp 16   Wt 48.1 kg   LMP 02/16/2019 (Approximate)   SpO2 100%   BMI 20.03 kg/m   Constitutional: Well developed, well nourished, no acute distress Eyes:  EOMI, conjunctiva normal bilaterally HENT: Normocephalic, atraumatic,mucus membranes moist. left external ear normal.  Pain with traction  on pinna, palpation of tragus.  No pain with palpation of mastoid.  Left external ear canal swollen, not erythematous.  Cerumen present, but able to visualize TM, which is normal, intact.  Pain with palpation of left TMJ.  No crepitus.  Right external ear, external ear canal, TM normal. Neck: No rash.  Positive tenderness over left trapezius and along the left side of the neck. Lymph: No cervical lymphadenopathy Respiratory: Normal inspiratory effort Cardiovascular: Normal rate GI: nondistended skin: No rash, skin intact Musculoskeletal: no deformities Neurologic: Alert & oriented x 3, no focal neuro deficits Psychiatric: Speech and behavior appropriate   ED Course   Medications - No data to  display  No orders of the defined types were placed in this encounter.   No results found for this or any previous visit (from the past 24 hour(s)). No results found.  ED Clinical Impression  1. Left ear pain   2. Acute otitis externa of left ear, unspecified type   3. Arthralgia of left temporomandibular joint      ED Assessment/Plan  She has pain with traction on pinna suggestive of an otitis externa.  External ear canal seems smaller than the other one although it is not erythematous.  Could be otitis externa so will send home with Ciprodex.  She also has tenderness over the left TMJ.  No crepitus.  Will send home with ibuprofen/Tylenol, Flexeril, soft diet for the next few days.  No evidence of otitis media or mastoiditis.  While she does have some left neck and trapezial tenderness, there is no rash suggestive of shingles as of yet, advised to return here or see her doctor if she does develop a rash and we can start her on antivirals and discontinue these medications.  Discussed medical decision-making, treatment plan and plan for follow-up with patient.  She agrees with plan.  Meds ordered this encounter  Medications  . ibuprofen (ADVIL) 600 MG tablet    Sig: Take 1 tablet (600 mg total) by mouth every 6 (six) hours as needed.    Dispense:  30 tablet    Refill:  0  . cyclobenzaprine (FLEXERIL) 10 MG tablet    Sig: Take 1 tablet (10 mg total) by mouth 3 (three) times daily as needed for muscle spasms.    Dispense:  20 tablet    Refill:  0  . ciprofloxacin-dexamethasone (CIPRODEX) OTIC suspension    Sig: Place 4 drops into the left ear 2 (two) times daily. X 7 days    Dispense:  7.5 mL    Refill:  0    *This clinic note was created using [employer]. Therefore, there may be occasional mistakes despite careful proofreading.   ?    Melynda Ripple, MD 03/13/19 [phone]

## 2019-03-12 NOTE — ED Triage Notes (Signed)
Pt c/o left ear pain for 2 days. States getting worse and now it hurts on the enitre left side of her face and neck.

## 2019-03-16 ENCOUNTER — Ambulatory Visit (INDEPENDENT_AMBULATORY_CARE_PROVIDER_SITE_OTHER): Payer: BC Managed Care – PPO | Admitting: Psychology

## 2019-03-16 ENCOUNTER — Other Ambulatory Visit: Payer: Self-pay

## 2019-03-16 DIAGNOSIS — F331 Major depressive disorder, recurrent, moderate: Secondary | ICD-10-CM | POA: Diagnosis not present

## 2019-03-16 DIAGNOSIS — F411 Generalized anxiety disorder: Secondary | ICD-10-CM | POA: Diagnosis not present

## 2019-03-16 NOTE — Progress Notes (Signed)
Virtual Visit via Video Note  I connected with Tynesha Free on 03/16/19 at  2:30 PM EST by a video enabled telemedicine application and verified that I am speaking with the correct person using two identifiers.   I discussed the limitations of evaluation and management by telemedicine and the availability of in person appointments. The patient expressed understanding and agreed to proceed.    I discussed the assessment and treatment plan with the patient. The patient was provided an opportunity to ask questions and all were answered. The patient agreed with the plan and demonstrated an understanding of the instructions.   The patient was advised to call back or seek an in-person evaluation if the symptoms worsen or if the condition fails to improve as anticipated.  I provided 50 minutes of non-face-to-face time during this encounter.   Forde Radon [facility]    THERAPIST PROGRESS NOTE  Session Time: 2.30pm-3.20pm  Participation Level: Active  Behavioral Response: Well GroomedAlertaffect wnl  Type of Therapy: Individual Therapy  Treatment Goals addressed: Diagnosis: GAd, MDD and goal 1.  Interventions: CBT and Supportive  Summary: Marzelle Rutten is a 24 y.o. female who presents with affect wnl.  Pt reported she has experienced more stress w/ school changes and decisions.  Pt reported that after last session- had decided to drop her classes to 2 and moved out from campus.  Pt reported that received a call yesterday that her appeal for fiancial aid is actually being approved- turing everything upside down- as now past the add class date and to be on campus needs to be full time student.  Pt reported that part of her is mad about the poor communication that doesn't want to return on campus or add classes to spite. Pt reported that she has to now find out if those are even options.  Pt discussed how she has been talking w/ a friend that has given support and not judging- pt had  feared this and is reframing for self to not base on past experiences.  Pt disucssed her classes and things that may be challenges and ways approaching for semester. Pt does need a letter of support from counselor re: return to classes this semester. Suicidal/Homicidal: Nowithout intent/plan  Therapist Response: Assessed pt current functioning per pt report. Processed w/pt coping w/ stressors and changes w/ school/financial aid status.  Explored w/pt her wants and options.  Discussed classes currently taking and things that may be barriers and ways of approaching.  Plan: Return again in 1 weeks, via webex. F/u as scheduled w/ Dr. Michae Kava.  Diagnosis: GAD, MDD  Forde Radon, [facility] 03/16/2019

## 2019-03-17 ENCOUNTER — Encounter (HOSPITAL_COMMUNITY): Payer: Self-pay | Admitting: Psychology

## 2019-03-23 ENCOUNTER — Ambulatory Visit (HOSPITAL_COMMUNITY): Payer: BC Managed Care – PPO | Admitting: Psychology

## 2019-03-30 ENCOUNTER — Ambulatory Visit (HOSPITAL_COMMUNITY): Payer: BC Managed Care – PPO | Admitting: Psychology

## 2019-04-06 ENCOUNTER — Other Ambulatory Visit: Payer: Self-pay

## 2019-04-06 ENCOUNTER — Ambulatory Visit (INDEPENDENT_AMBULATORY_CARE_PROVIDER_SITE_OTHER): Payer: BC Managed Care – PPO | Admitting: Psychology

## 2019-04-06 DIAGNOSIS — F411 Generalized anxiety disorder: Secondary | ICD-10-CM

## 2019-04-06 DIAGNOSIS — F331 Major depressive disorder, recurrent, moderate: Secondary | ICD-10-CM

## 2019-04-06 NOTE — Progress Notes (Signed)
Virtual Visit via Video Note  I connected with Melanie Meyer on 04/06/19 at  2:30 PM EST by a video enabled telemedicine application and verified that I am speaking with the correct person using two identifiers.   I discussed the limitations of evaluation and management by telemedicine and the availability of in person appointments. The patient expressed understanding and agreed to proceed.   I discussed the assessment and treatment plan with the patient. The patient was provided an opportunity to ask questions and all were answered. The patient agreed with the plan and demonstrated an understanding of the instructions.   The patient was advised to call back or seek an in-person evaluation if the symptoms worsen or if the condition fails to improve as anticipated.  I provided 42 minutes of non-face-to-face time during this encounter.   Forde Radon [facility]    THERAPIST PROGRESS NOTE  Session Time: 2.43pm-3.25pm  Participation Level: Active  Behavioral Response: Well GroomedAlertaffect wnl  Type of Therapy: Individual Therapy  Treatment Goals addressed: Diagnosis: MDD, GAD and goal.1   Interventions: CBT and Strength-based  Summary: Melanie Meyer is a 24 y.o. female who presents with affect wnl.  Pt reported that she had to miss the past 2 weeks w/ ADHd testing. Pt reported she has one form to turn in this week and then should hear results in couple weeks.  Pt reported that school just informed they couldn't open documentation sent and needs to resend for financial aid approval.  Pt reported that she 2 classes are going well and she is keeping up w/ the workload.  Pt is feeling good about this, but than wonders if learning anything.  pt reported that she went to see friends on Monday and this was positive.  Pt reminds self that she minimizes these friendships and that really positive when pushes self for interactions.  Pt reported that she has been struggling w/ feeling that  can't plan for future because planning feels uncertain.  Pt was able to acknowledge to return to present focus.  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  Pt agrees to f/u w/ potential support groups at school for continued skill building and using resources.  Suicidal/Homicidal: Nowithout intent/plan  Therapist Response: Assessed pt current functioning per pt report.  Processed w/ pt recent stressors and negative thought patterns.  Reflected pt focus on worry for future w/ things not in her control and focus on present and actions can take currently for self and goals.  Encouraged pt to [employer] w/ counseing/support groups.    Plan: Return again in 1 weeks, via webex.  F/u w/ Dr. Alena Bills.   Diagnosis:  GAD, MDD   Forde Radon, [facility] 04/06/2019

## 2019-04-13 ENCOUNTER — Other Ambulatory Visit: Payer: Self-pay

## 2019-04-13 ENCOUNTER — Ambulatory Visit (INDEPENDENT_AMBULATORY_CARE_PROVIDER_SITE_OTHER): Payer: BC Managed Care – PPO | Admitting: Psychology

## 2019-04-13 DIAGNOSIS — F411 Generalized anxiety disorder: Secondary | ICD-10-CM | POA: Diagnosis not present

## 2019-04-13 DIAGNOSIS — F331 Major depressive disorder, recurrent, moderate: Secondary | ICD-10-CM | POA: Diagnosis not present

## 2019-04-13 NOTE — Progress Notes (Signed)
Virtual Visit via Video Note  I connected with Melanie Meyer on 04/13/19 at  2:30 PM EST by a video enabled telemedicine application and verified that I am speaking with the correct person using two identifiers.   I discussed the limitations of evaluation and management by telemedicine and the availability of in person appointments. The patient expressed understanding and agreed to proceed.    I discussed the assessment and treatment plan with the patient. The patient was provided an opportunity to ask questions and all were answered. The patient agreed with the plan and demonstrated an understanding of the instructions.   The patient was advised to call back or seek an in-person evaluation if the symptoms worsen or if the condition fails to improve as anticipated.  I provided 54 minutes of non-face-to-face time during this encounter.   Forde Radon [facility]    THERAPIST PROGRESS NOTE  Session Time: 2.30pm-3.24pm  Participation Level: Active  Behavioral Response: Well GroomedAlertaffect bright  Type of Therapy: Individual Therapy  Treatment Goals addressed: Diagnosis: MDD, GAD and goal 1.  Interventions: CBT and Strength-based  Summary: Melanie Meyer is a 24 y.o. female who presents with affect bright.  Pt reported that she received good news about her financial aid appeal being approved and so will be calling to the school to understand next steps.  Pt reported that she is getting her work completed for her 2 classes- but she is ready for the semester to be done as not getting much from these classes.  Pt reported that she will start to work at least one day a week at [employer] again, but still looking for other employment as not enough hours/pay.  Pt discussed how she got out this week and went to [place] explored and found coffee shop to study and was positive experience.  Pt discussed how she is learning more about what is a good fit for her and being in  a city w/ options is positive. Pt reported that she isn't taking medication and hasn't for awhile and discussed how she has struggled all along w/ building habit of taking. Pt discussed that doesn't currently have a routine for much.  Pt discussed that she does want to attempt to take medication for her mental health needs and will call to the office to schedule f/u w/ Dr. Michae Kava.  Pt is considering ideas discussed around increased routine.    Suicidal/Homicidal: Nowithout intent/plan  Therapist Response: ASsessed pt current functioning per pt report.  Explored w/pt steps she has been taking for self w/ school, getting out of house and connecting w/ others. Reflected some strengths and what she is learning about self.  Explored w/pt medication and lack of taking and barriers to and wants.  Discussed w/ pt ways of increasing a daily routine w/ medication and w/ a routine/schedule for self that fits her wants and needs.  Plan: Return again in 2 weeks, via webex.  Pt reports she will call to schedule f/u w/ Dr. Michae Kava.   Diagnosis: MDD, GAD   Forde Radon, [facility] 04/13/2019

## 2019-04-23 IMAGING — DX DG HIP (WITH OR WITHOUT PELVIS) 2-3V*L*
3 series · 3 of 3 positions shown · non-contrast
Comparison: None.

CLINICAL DATA: Left hip pain for the past week.  No known injury.

EXAM:
DG HIP (WITH OR WITHOUT PELVIS) 2-3V LEFT

[pelvis ap]
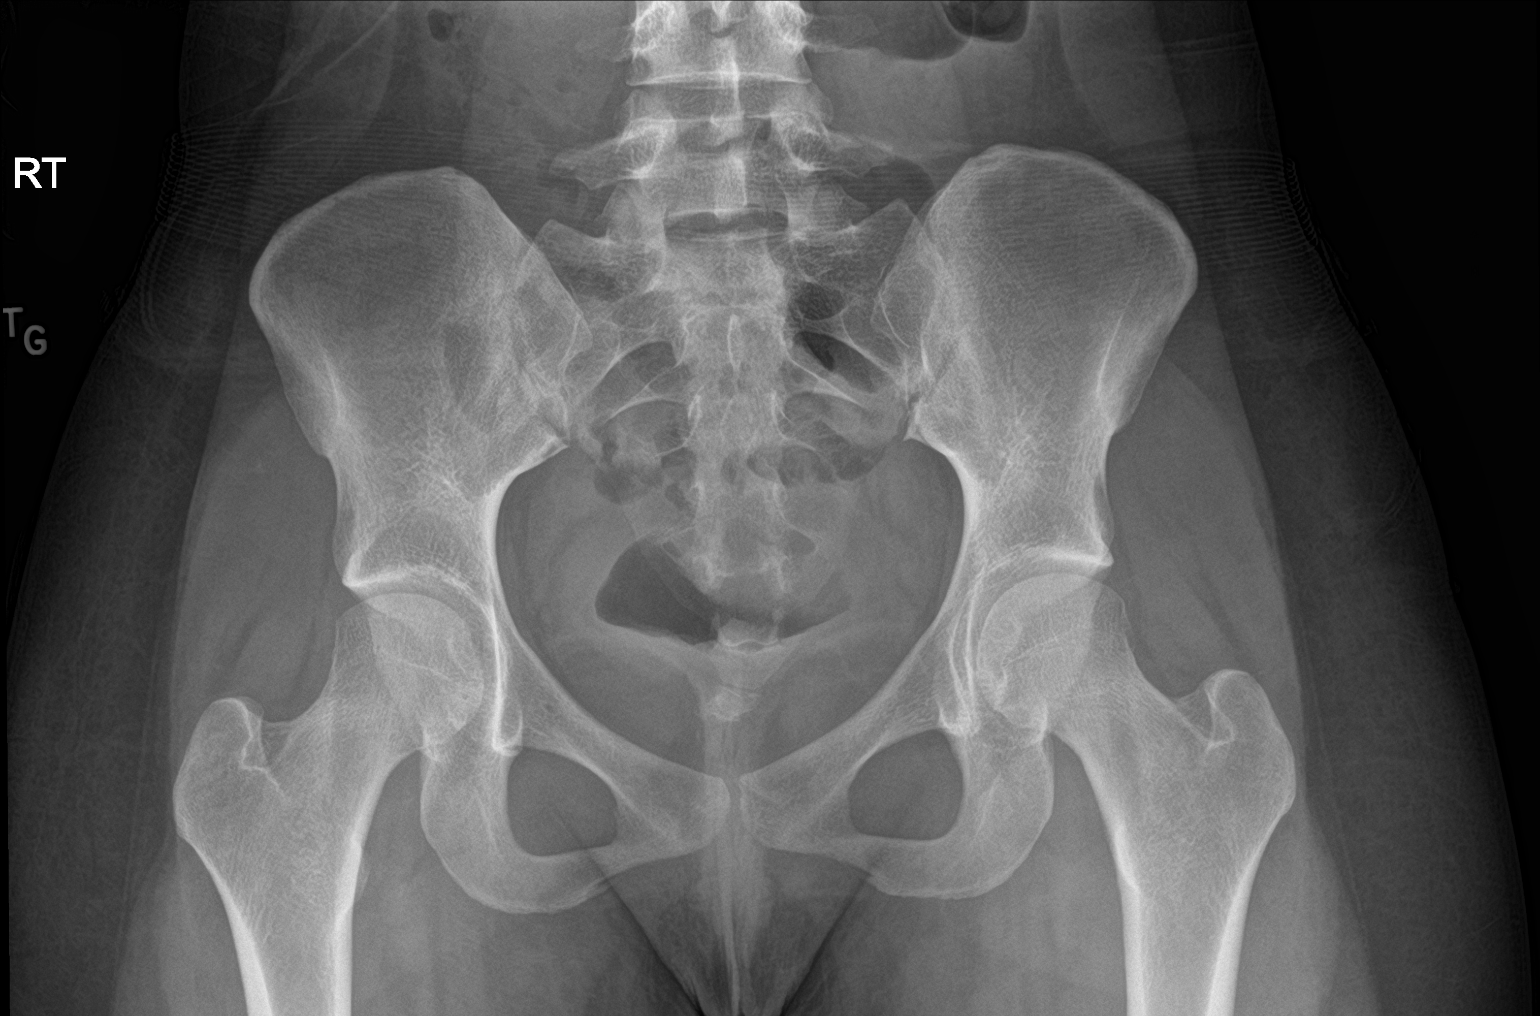

[hip ap]
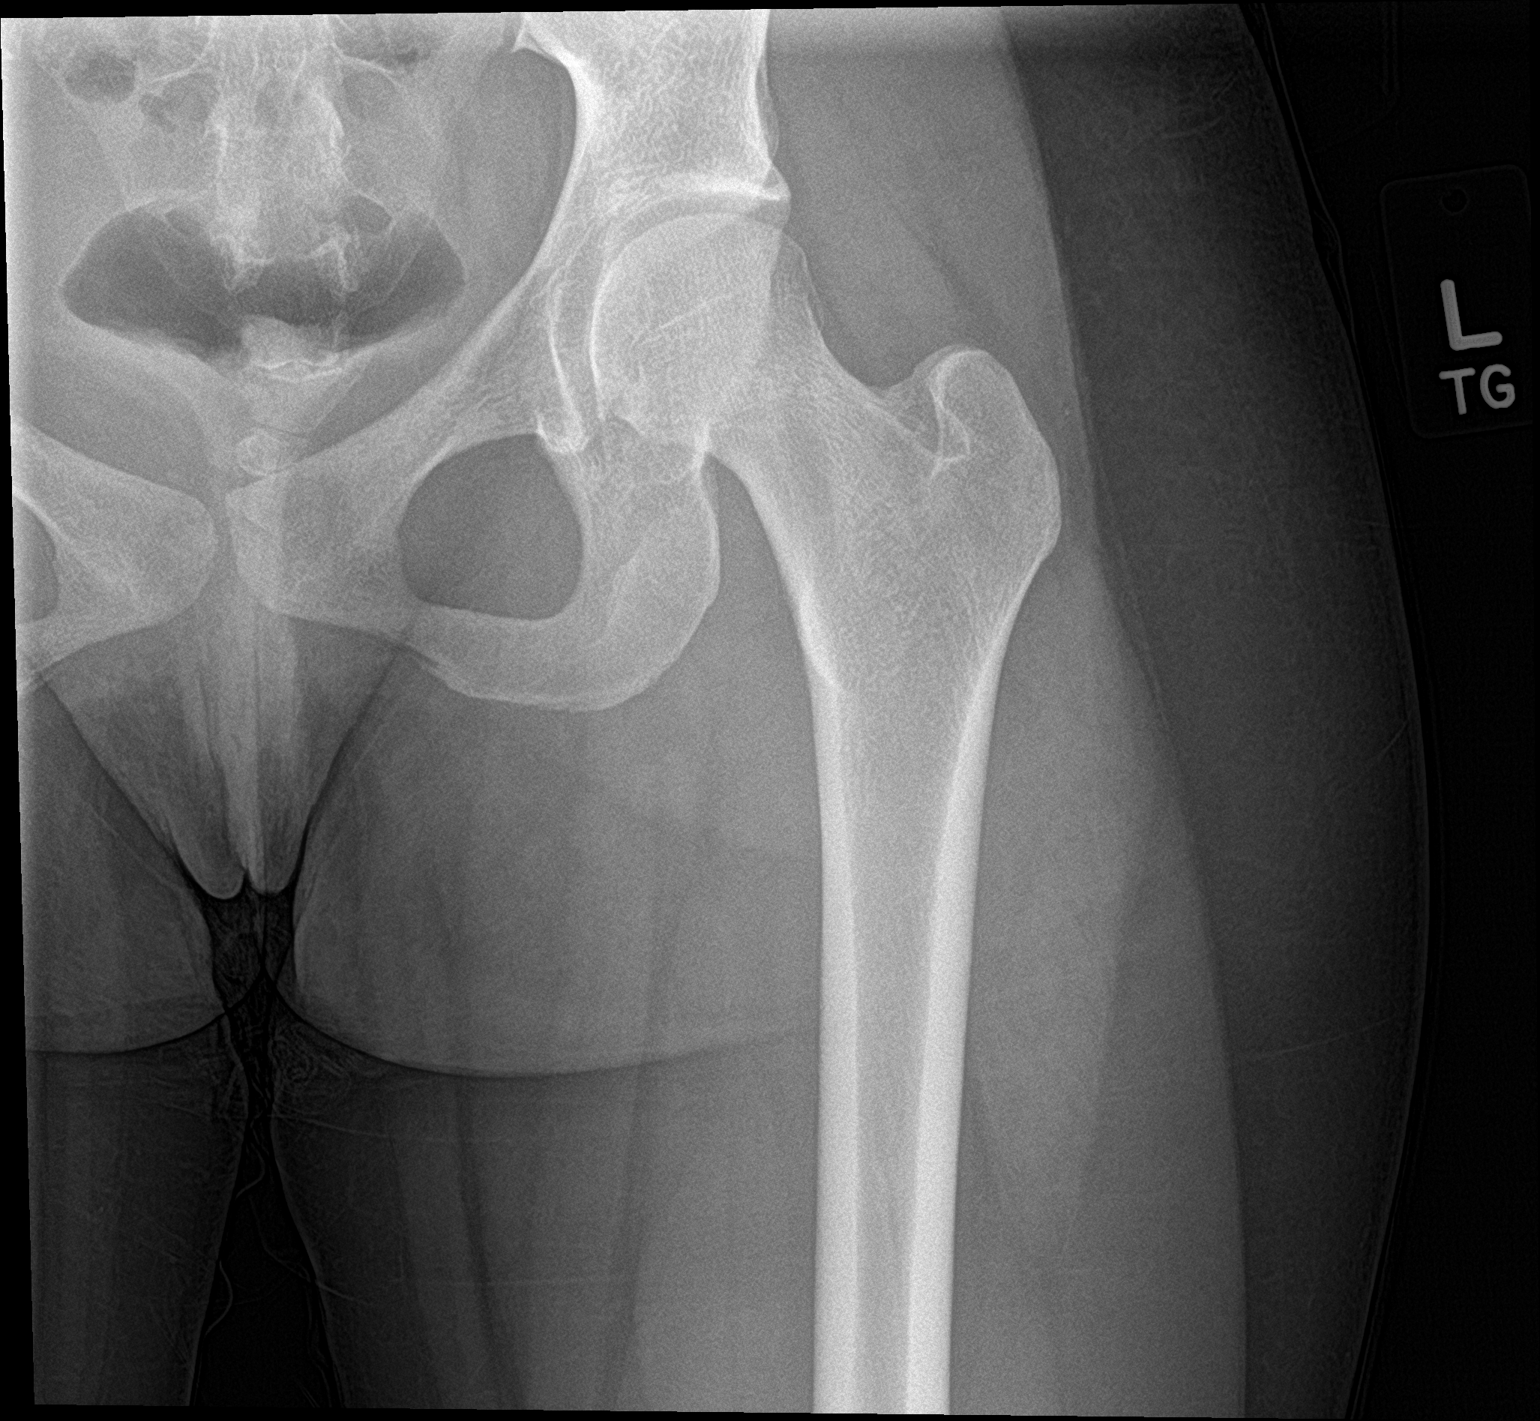

[hip lat]
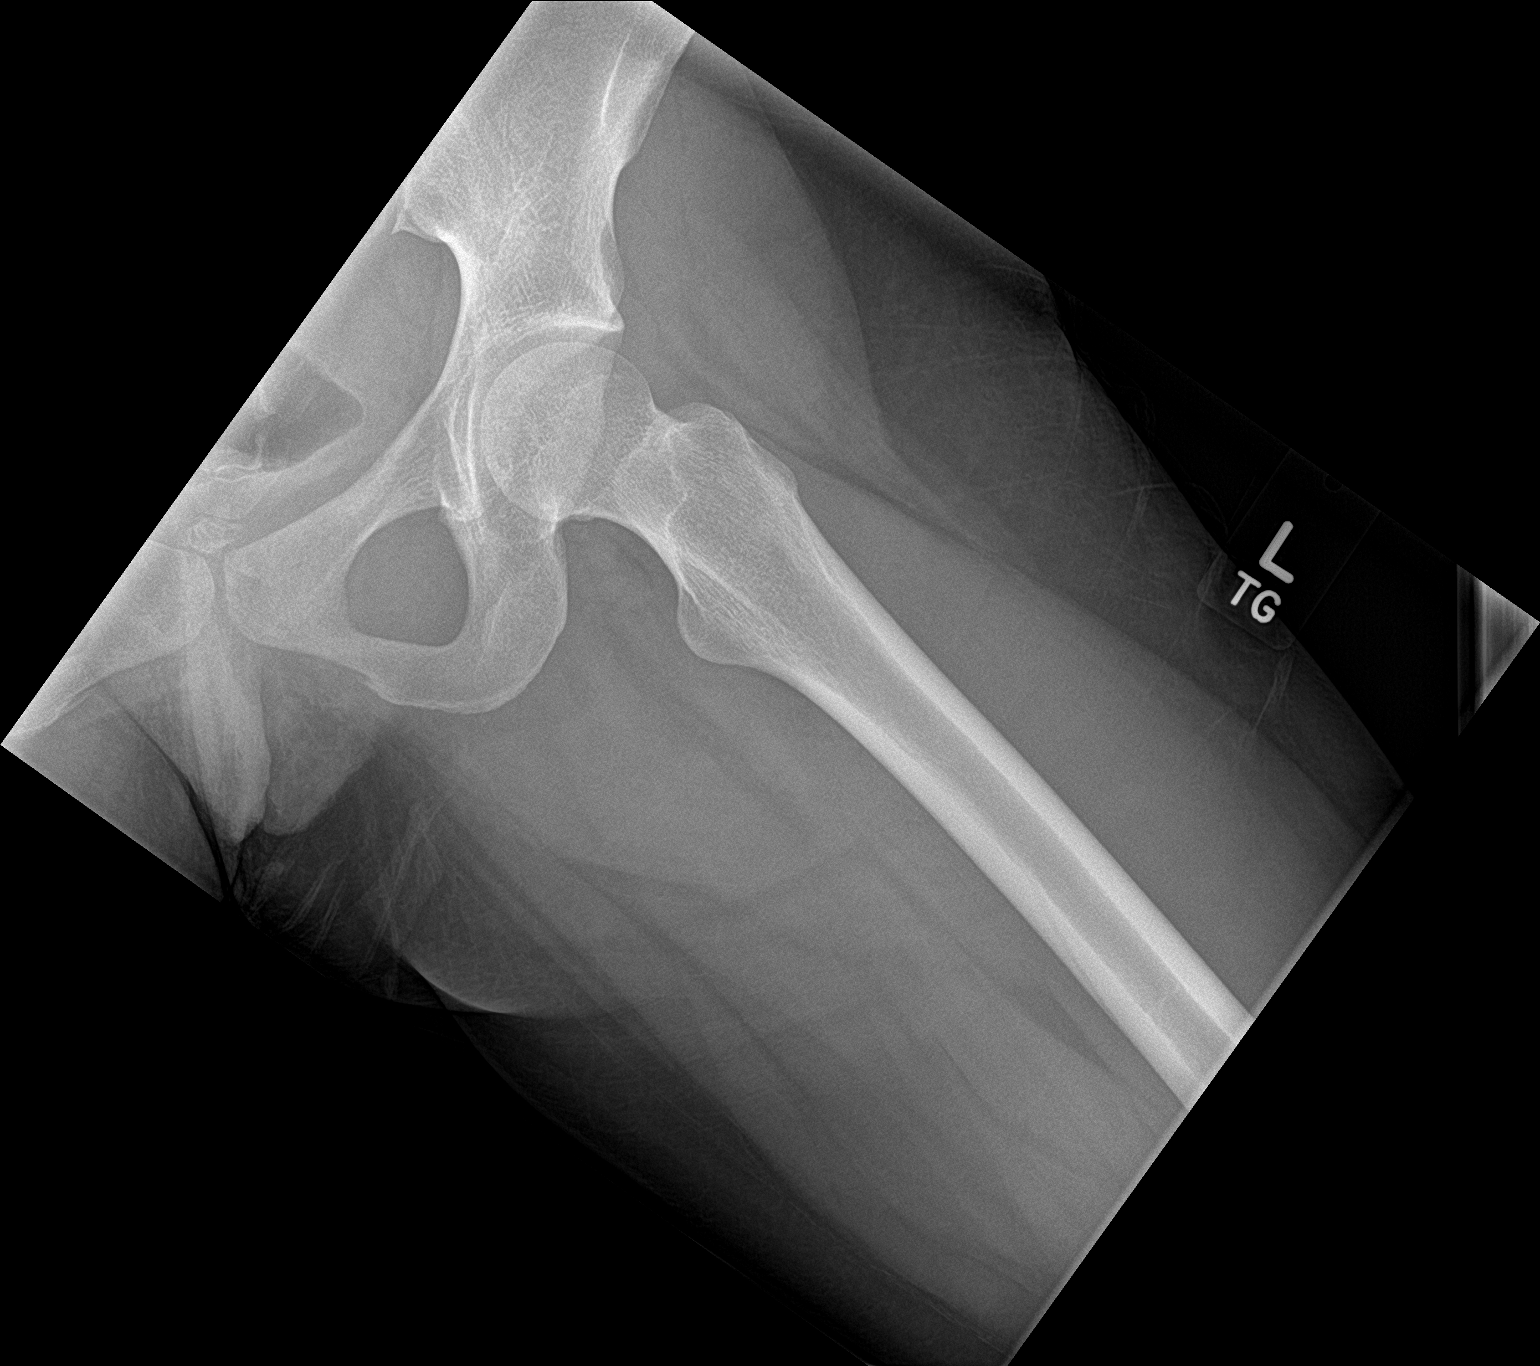

[3 of 3 positions shown; findings below may reference images not displayed]

FINDINGS: There is no evidence of hip fracture or dislocation. There is no
evidence of arthropathy or other focal bone abnormality.
IMPRESSION: Negative.

## 2019-05-02 ENCOUNTER — Other Ambulatory Visit: Payer: Self-pay

## 2019-05-02 ENCOUNTER — Ambulatory Visit (INDEPENDENT_AMBULATORY_CARE_PROVIDER_SITE_OTHER): Payer: BC Managed Care – PPO | Admitting: Psychology

## 2019-05-02 DIAGNOSIS — F330 Major depressive disorder, recurrent, mild: Secondary | ICD-10-CM

## 2019-05-02 DIAGNOSIS — F411 Generalized anxiety disorder: Secondary | ICD-10-CM

## 2019-05-02 NOTE — Progress Notes (Signed)
Virtual Visit via Video Note  I connected with Melanie Meyer on 05/02/19 at  3:30 PM EDT by a video enabled telemedicine application- pt camera off during session- and verified that I am speaking with the correct person using two identifiers.   I discussed the limitations of evaluation and management by telemedicine and the availability of in person appointments. The patient expressed understanding and agreed to proceed.    I discussed the assessment and treatment plan with the patient. The patient was provided an opportunity to ask questions and all were answered. The patient agreed with the plan and demonstrated an understanding of the instructions.   The patient was advised to call back or seek an in-person evaluation if the symptoms worsen or if the condition fails to improve as anticipated.  I provided 40 minutes of non-face-to-face time during this encounter.   Forde Radon [facility]    THERAPIST PROGRESS NOTE  Session Time: 3.30pm-4.10pm  Participation Level: Active  Behavioral Response: NAAlertaffect blunted  Type of Therapy: Individual Therapy  Treatment Goals addressed: Diagnosis: GAD, MDD and goal 1.  Interventions: CBT and Other: mindfulness  Summary: Melanie Meyer is a 24 y.o. female who presents with affect blunted. Pt reported that she is currently lying down as not feeling well after receiving the COVID vaccine 2 days ago.  Pt reported that she is doing well w/ her classes- she still doesn't feel that she is getting much from them- but is completing her work and attending class.  Pt reported that was able to receive clarification about her probation status of financial aid and if grades remain up this semester she will be off probation. 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  Pt reported that  she has enjoyed hanging out w/ friends couple of times.  Pt reported that she continues to struggle w/ ruminating worry- especially on days like today when not a lot happening and no interaction w/ others.  Pt was receptive to practice of mindfulness w/ nonjudgement.  Pt agrees to practice and may look into [employer] groups that are available for mindfulness.  Pt reported she goes tomorrow for ADHD testing results.  Suicidal/Homicidal: Nowithout intent/plan  Therapist Response: Assessed pt current functioning per her report.  Discussed progress w/ this semester and how will open up for more next semester.  Processed w/pt her worry and ruminating on this.  Discussed coping skill of mindfulness when recognizes that ruminating on worry.  Encouraged practice w/ non judgement and how to approach days w/ less going on as self care days.  Plan: Return again in 1 weeks, via webex. F/u as scheduled w/ Dr. Michae Kava.  Diagnosis: GAD, MDD   Forde Radon, [facility] 05/02/2019

## 2019-05-03 ENCOUNTER — Ambulatory Visit (HOSPITAL_COMMUNITY): Payer: BC Managed Care – PPO | Admitting: Psychology

## 2019-05-03 ENCOUNTER — Other Ambulatory Visit: Payer: Self-pay

## 2019-05-09 ENCOUNTER — Ambulatory Visit (INDEPENDENT_AMBULATORY_CARE_PROVIDER_SITE_OTHER): Payer: BC Managed Care – PPO | Admitting: Psychology

## 2019-05-09 ENCOUNTER — Other Ambulatory Visit: Payer: Self-pay

## 2019-05-09 DIAGNOSIS — F411 Generalized anxiety disorder: Secondary | ICD-10-CM | POA: Diagnosis not present

## 2019-05-09 DIAGNOSIS — F331 Major depressive disorder, recurrent, moderate: Secondary | ICD-10-CM | POA: Diagnosis not present

## 2019-05-09 NOTE — Progress Notes (Signed)
Virtual Visit via Video Note  I connected with Melanie Meyer on 05/09/19 at  1:30 PM EDT by a video enabled telemedicine applicatio, but pt didn't turn on video portion, and verified that I am speaking with the correct person using two identifiers.   I discussed the limitations of evaluation and management by telemedicine and the availability of in person appointments. The patient expressed understanding and agreed to proceed.    I discussed the assessment and treatment plan with the patient. The patient was provided an opportunity to ask questions and all were answered. The patient agreed with the plan and demonstrated an understanding of the instructions.   The patient was advised to call back or seek an in-person evaluation if the symptoms worsen or if the condition fails to improve as anticipated.  I provided 43 minutes of non-face-to-face time during this encounter.   Forde Radon [facility]    THERAPIST PROGRESS NOTE  Session Time: 1.31pm-2.14pm  Participation Level: Active  Behavioral Response: NAAlertDepressed  Type of Therapy: Individual Therapy  Treatment Goals addressed: Diagnosis: MDD, GAD and goal 1.  Interventions: CBT and Other: self care  Summary: Melanie Meyer is a 24 y.o. female who presents with affect depressed.  Pt reported that she had her meeting w/ psychologist for results that indicated not an ADHD dx but symptoms of inattentiveness present- likely due to dx of MDD, GAD.  Pt reported that she took this really hard as felt that if ADHD would have new answers and options.  Pt reported she became more depressed last week, difficulty getting out of bed and didn't attend class.  Pt did reach out to profressors next day and inform and seek to turn in assignments completed late.  Pt hasn't heard back.  Pt thought process today slow- pt reports she was very tired- just woke for appointment and still in bed.  Pt reported that she is using tv as distraction and  watched until early morning maybe going to bed at 5am.  Pt reported that mom is helping to be reminder for taking meds as she is struggling w/ this and helped to push her to get out of bed last week.  Pt acknowledge that would be benefit for self to leave bed after getting off session and getting something to eat.  Pt reported that has been struggle to just make those decisions as well.  Pt informed that psychologist gave recommendations for f/u w/ counselors and will look into these options.  Suicidal/Homicidal: Nowithout intent/plan  Therapist Response: Assessed pt current functioning per pt report. Processed w/pt increased depressed mood.  Discussed thoughts of discouragement from lack of dx and assisting in reframing to focus on tx of current dx.  Reiterated research of counseling and medication management together as best response and supportive of pt utilizing her support of parents to be consistent w/ medication.  Discussed self care skills today and benefit to change from space of bed and engage in self care.  Encouraged pt to f/u w/ recommendations for other counselor to assist and finding best fit for her tx needs.  Plan: Return again in 2 weeks for Dr. Michae Kava.  Pt w/ support of parents to f/u w/ referrals provided by psychologist for counseling. No f/u scheduled together at this time.  Pt is aware of how to reach out to counselor if other referrals needed.  Diagnosis: MDD, GAD  Forde Radon [facility] 05/09/2019

## 2019-05-10 ENCOUNTER — Other Ambulatory Visit (HOSPITAL_COMMUNITY): Payer: Self-pay | Admitting: Psychiatry

## 2019-05-10 ENCOUNTER — Other Ambulatory Visit (HOSPITAL_COMMUNITY): Payer: Self-pay | Admitting: *Deleted

## 2019-05-10 DIAGNOSIS — F411 Generalized anxiety disorder: Secondary | ICD-10-CM

## 2019-05-10 DIAGNOSIS — F331 Major depressive disorder, recurrent, moderate: Secondary | ICD-10-CM

## 2019-05-10 MED ORDER — BUSPIRONE HCL 10 MG PO TABS: 10.0000 mg | ORAL_TABLET | Freq: Two times a day (BID) | ORAL | 0 refills | Status: DC

## 2019-05-10 MED ORDER — ESCITALOPRAM OXALATE 20 MG PO TABS: 20.0000 mg | ORAL_TABLET | Freq: Every day | ORAL | 0 refills | Status: DC

## 2019-05-16 ENCOUNTER — Ambulatory Visit (HOSPITAL_COMMUNITY): Payer: BC Managed Care – PPO | Admitting: Psychology

## 2019-05-26 ENCOUNTER — Other Ambulatory Visit: Payer: Self-pay

## 2019-05-26 ENCOUNTER — Ambulatory Visit (INDEPENDENT_AMBULATORY_CARE_PROVIDER_SITE_OTHER): Payer: BC Managed Care – PPO | Admitting: Psychiatry

## 2019-05-26 ENCOUNTER — Encounter (HOSPITAL_COMMUNITY): Payer: Self-pay | Admitting: Psychiatry

## 2019-05-26 DIAGNOSIS — F330 Major depressive disorder, recurrent, mild: Secondary | ICD-10-CM | POA: Diagnosis not present

## 2019-05-26 DIAGNOSIS — F411 Generalized anxiety disorder: Secondary | ICD-10-CM | POA: Diagnosis not present

## 2019-05-26 DIAGNOSIS — F331 Major depressive disorder, recurrent, moderate: Secondary | ICD-10-CM

## 2019-05-26 MED ORDER — PAROXETINE HCL 20 MG PO TABS: 20.0000 mg | ORAL_TABLET | Freq: Every day | ORAL | 1 refills | Status: DC

## 2019-05-26 MED ORDER — BUSPIRONE HCL 10 MG PO TABS: 10.0000 mg | ORAL_TABLET | Freq: Two times a day (BID) | ORAL | 0 refills | Status: DC

## 2019-05-26 NOTE — Progress Notes (Signed)
Virtual Visit via Telephone Note  I connected with Adiya Selmer on 05/26/19 at  2:45 PM EDT by telephone and verified that I am speaking with the correct person using two identifiers.  Location: Patient: home Provider: office   I discussed the limitations, risks, security and privacy concerns of performing an evaluation and management service by telephone and the availability of in person appointments. I also discussed with the patient that there may be a patient responsible charge related to this service. The patient expressed understanding and agreed to proceed.   History of Present Illness: Lenore did not do well last semester and it resulted in her losing her financial aid. She was forced to move off campus at the end of the fall semester. It made her feel depressed and anxious. She is now home alone most of the day and is not able to see her friends often. She is feeling somewhat shamed that her friend's are moving on and graduating but Cornelious is not. She spends a lot of comparing herself to them and wonders why she can't do what they are. It makes it uncomfortable to hang out with them. Francetta does not like to visit the dorm she lived it because her friends are close and she feels left out. Temica feels bad reaching out for support from family and friends because everyone is busy. Robbi is only taking 2 classes right now due to cost. Her goal is to finish the semester. Lubna is working at [employer] and it keeps her out the house. Aleese feels sensitive and insecure. She ruminates on her failures. Nikolette has racing thoughts and anxiety about small things. She is always afraid and is worried she won't be able to keep her job. Berma denies SI/HI. Ellieana admits that when the semester ended she stopped taking Lexapro and Buspar. Now she is taking Buspar but ran out of Lexapro 2 weeks ago.  Anjanae is eating but not a lot.    Observations/Objective:  General Appearance:  unable to assess  Eye Contact:  unable to assess  Speech:  Clear and Coherent and Normal Rate  Volume:  Normal  Mood:  Anxious and Depressed  Affect:  Congruent and Tearful  Thought Process:  Coherent and Descriptions of Associations: Circumstantial  Orientation:  Full (Time, Place, and Person)  Thought Content:  Logical  Suicidal Thoughts:  No  Homicidal Thoughts:  No  Memory:  Immediate;   Good  Judgement:  Good  Insight:  Good  Psychomotor Activity: unable to assess  Concentration:  Concentration: Good and Attention Span: Good  Recall:  Good  Fund of Knowledge:  Good  Language:  Good  Akathisia:  unable to assess  Handed:  Right  AIMS (if indicated):     Assets:  Communication Skills Desire for Improvement Financial Resources/Insurance Housing Physical Health Talents/Skills Transportation Vocational/Educational  ADL's:  unable to assess  Cognition:  WNL  Sleep:         Assessment and Plan: GAD; MDD-recurrent, mild  Status of current symptoms: worsening depression and anxiety  D/c Lexapro  Start trial of Paxil 20mg  po qD Buspar 10mg  po BID  She is looking into starting therapy  Follow Up Instructions: In 2-3 months or sooner if needed   I discussed the assessment and treatment plan with the patient. The patient was provided an opportunity to ask questions and all were answered. The patient agreed with the plan and demonstrated an understanding of the instructions.   The patient was  advised to call back or seek an in-person evaluation if the symptoms worsen or if the condition fails to improve as anticipated.  I provided 30 minutes of non-face-to-face time during this encounter.   Charlcie Cradle, MD

## 2019-06-17 ENCOUNTER — Other Ambulatory Visit (HOSPITAL_COMMUNITY): Payer: Self-pay | Admitting: Psychiatry

## 2019-06-20 ENCOUNTER — Encounter (HOSPITAL_COMMUNITY): Payer: Self-pay | Admitting: Psychology

## 2019-06-20 NOTE — Progress Notes (Signed)
Melanie Meyer is a 24 y.o. female patient who is discharged from counseling as pt is seeking counseling with another provider in community.  Outpatient Therapist Discharge Summary  [name]    [birth year]   Admission Date: 07/19/18   Discharge Date:  05/09/19 Reason for Discharge:  Pt choice Diagnosis:  MDD, GAD Comments:  Pt continues w/ Dr. Curt Jews [name], [facility]

## 2019-07-07 ENCOUNTER — Telehealth (INDEPENDENT_AMBULATORY_CARE_PROVIDER_SITE_OTHER): Payer: BC Managed Care – PPO | Admitting: Psychiatry

## 2019-07-07 ENCOUNTER — Other Ambulatory Visit: Payer: Self-pay

## 2019-07-07 ENCOUNTER — Encounter (HOSPITAL_COMMUNITY): Payer: Self-pay | Admitting: Psychiatry

## 2019-07-07 DIAGNOSIS — F411 Generalized anxiety disorder: Secondary | ICD-10-CM | POA: Diagnosis not present

## 2019-07-07 DIAGNOSIS — F331 Major depressive disorder, recurrent, moderate: Secondary | ICD-10-CM | POA: Diagnosis not present

## 2019-07-07 MED ORDER — BUSPIRONE HCL 10 MG PO TABS: 10.0000 mg | ORAL_TABLET | Freq: Two times a day (BID) | ORAL | 0 refills | Status: DC

## 2019-07-07 MED ORDER — PAROXETINE HCL 20 MG PO TABS: 20.0000 mg | ORAL_TABLET | Freq: Every day | ORAL | 0 refills | Status: DC

## 2019-07-07 MED ORDER — PAROXETINE HCL 20 MG PO TABS: 20.0000 mg | ORAL_TABLET | Freq: Every day | ORAL | 1 refills | Status: DC

## 2019-07-07 NOTE — Progress Notes (Signed)
Virtual Visit via Telephone Note  I connected with Melanie Meyer on 07/07/19 at  2:30 PM EDT by telephone and verified that I am speaking with the correct person using two identifiers.  Location: Patient: home Provider: office   I discussed the limitations, risks, security and privacy concerns of performing an evaluation and management service by telephone and the availability of in person appointments. I also discussed with the patient that there may be a patient responsible charge related to this service. The patient expressed understanding and agreed to proceed.   History of Present Illness: Melanie Meyer feels she is doing better. She is keeping busy with work and home. Her anxiety is better and it is manageable. Her sleep is good. She denies any depression since last visit. She denies SI/HI.    Observations/Objective:  General Appearance: unable to assess  Eye Contact:  unable to assess  Speech:  Clear and Coherent and Normal Rate  Volume:  Normal  Mood:  Anxious  Affect:  Full Range  Thought Process:  Goal Directed, Linear and Descriptions of Associations: Intact  Orientation:  Full (Time, Place, and Person)  Thought Content:  Logical  Suicidal Thoughts:  No  Homicidal Thoughts:  No  Memory:  Immediate;   Good  Judgement:  Good  Insight:  Good  Psychomotor Activity: unable to assess  Concentration:  Concentration: Good  Recall:  Good  Fund of Knowledge:  Good  Language:  Good  Akathisia:  unable to assess  Handed:  Right  AIMS (if indicated):     Assets:  Communication Skills Desire for Improvement Financial Resources/Insurance Housing Leisure Time Resilience Social Support Talents/Skills Transportation Vocational/Educational  ADL's:  unable to assess  Cognition:  WNL  Sleep:         Assessment and Plan:  GAD; MDD- recurrent, mild  Paxil 20mg  po qHS Buspar 10mg  po BID   Follow Up Instructions: In 2-3 months or sooner if needed   I discussed the  assessment and treatment plan with the patient. The patient was provided an opportunity to ask questions and all were answered. The patient agreed with the plan and demonstrated an understanding of the instructions.   The patient was advised to call back or seek an in-person evaluation if the symptoms worsen or if the condition fails to improve as anticipated.  I provided 15 minutes of non-face-to-face time during this encounter.   , MD

## 2019-09-15 ENCOUNTER — Other Ambulatory Visit: Payer: Self-pay

## 2019-09-15 ENCOUNTER — Telehealth (HOSPITAL_COMMUNITY): Payer: BC Managed Care – PPO | Admitting: Psychiatry

## 2019-09-30 ENCOUNTER — Other Ambulatory Visit (HOSPITAL_COMMUNITY): Payer: Self-pay | Admitting: Psychiatry

## 2019-09-30 DIAGNOSIS — F411 Generalized anxiety disorder: Secondary | ICD-10-CM

## 2019-09-30 DIAGNOSIS — F331 Major depressive disorder, recurrent, moderate: Secondary | ICD-10-CM

## 2019-10-11 ENCOUNTER — Other Ambulatory Visit (HOSPITAL_COMMUNITY): Payer: Self-pay | Admitting: Psychiatry

## 2019-10-11 DIAGNOSIS — F331 Major depressive disorder, recurrent, moderate: Secondary | ICD-10-CM

## 2019-10-11 DIAGNOSIS — F411 Generalized anxiety disorder: Secondary | ICD-10-CM

## 2019-10-30 ENCOUNTER — Other Ambulatory Visit (HOSPITAL_COMMUNITY): Payer: Self-pay | Admitting: Psychiatry

## 2019-12-28 ENCOUNTER — Other Ambulatory Visit (HOSPITAL_COMMUNITY): Payer: Self-pay | Admitting: Psychiatry

## 2019-12-28 DIAGNOSIS — F331 Major depressive disorder, recurrent, moderate: Secondary | ICD-10-CM

## 2019-12-28 DIAGNOSIS — F411 Generalized anxiety disorder: Secondary | ICD-10-CM

## 2020-01-22 ENCOUNTER — Other Ambulatory Visit (HOSPITAL_COMMUNITY): Payer: Self-pay | Admitting: Psychiatry

## 2020-01-22 DIAGNOSIS — F411 Generalized anxiety disorder: Secondary | ICD-10-CM

## 2020-01-22 DIAGNOSIS — F331 Major depressive disorder, recurrent, moderate: Secondary | ICD-10-CM

## 2020-02-09 ENCOUNTER — Other Ambulatory Visit: Payer: Self-pay

## 2020-02-09 ENCOUNTER — Emergency Department
Admission: EM | Admit: 2020-02-09 | Discharge: 2020-02-09 | Disposition: A | Discharge status: Home / Self Care | Payer: BC Managed Care – PPO | Source: Home / Self Care | Attending: Family Medicine | Admitting: Family Medicine

## 2020-02-09 DIAGNOSIS — H66002 Acute suppurative otitis media without spontaneous rupture of ear drum, left ear: Secondary | ICD-10-CM

## 2020-02-09 MED ORDER — AMOXICILLIN 875 MG PO TABS: 875.0000 mg | ORAL_TABLET | Freq: Two times a day (BID) | ORAL | 0 refills | Status: AC

## 2020-02-09 NOTE — ED Provider Notes (Signed)
  [facility]   [id number] 02/09/20 Arrival Time: 1207  ASSESSMENT & PLAN:  1. Non-recurrent acute suppurative otitis media of left ear without spontaneous rupture of tympanic membrane     Begin: Meds ordered this encounter  Medications  . amoxicillin (AMOXIL) 875 MG tablet    Sig: Take 1 tablet (875 mg total) by mouth 2 (two) times daily for 10 days.    Dispense:  20 tablet    Refill:  0    Discussed typical duration of symptoms. OTC symptom care as needed. Ensure adequate fluid intake and rest.   Follow-up Information    [facility].   Specialty: Urgent Care Why: If worsening or failing to improve as anticipated. Contact information: [address] [place] [address] [phone]              Reviewed expectations re: course of current medical issues. Questions answered. Outlined signs and symptoms indicating need for more acute intervention. Patient verbalized understanding. After Visit Summary given.   SUBJECTIVE: History from: patient.  Melanie Meyer is a 24 y.o. female who presents with complaint of left otalgia; without drainage; without bleeding. Onset gradual, past few days; decreased hearing. Recent cold symptoms: minimal. Fever: no. Overall normal PO intake without n/v. Sick contacts: no. OTC treatment: none.  Social History   Tobacco Use  Smoking Status Never Smoker  Smokeless Tobacco Never Used     OBJECTIVE:  Vitals:   02/09/20 1216  BP: 114/76  Pulse: 80  Resp: 18  Temp: 98.1 F (36.7 C)  TempSrc: Oral  SpO2: 100%     General appearance: alert; NAD Ear Canal: normal TM: left: erythematous, dull, bulging Neck: supple without LAD Lungs: unlabored respirations, symmetrical air entry; cough: absent; no respiratory distress Skin: warm and dry Psychological: alert and cooperative; normal mood and affect  No Known Allergies  Past Medical History:  Diagnosis  Date  . Anxiety   . Depression   . Frequent headaches    Family History  Problem Relation Age of Onset  . Depression Sister   . Anxiety disorder Sister   . Depression Brother   . Anxiety disorder Brother    Social History   Socioeconomic History  . Marital status: Single    Spouse name: Not on file  . Number of children: 0  . Years of education: Not on file  . Highest education level: Some college, no degree  Occupational History  . Not on file  Tobacco Use  . Smoking status: Never Smoker  . Smokeless tobacco: Never Used  Vaping Use  . Vaping Use: Never used  Substance and Sexual Activity  . Alcohol use: No  . Drug use: No  . Sexual activity: Never  Other Topics Concern  . Not on file  Social History Narrative  . Not on file   Social Determinants of Health   Financial Resource Strain: Not on file  Food Insecurity: Not on file  Transportation Needs: Not on file  Physical Activity: Not on file  Stress: Not on file  Social Connections: Not on file  Intimate Partner Violence: Not on file            Mardella Layman, MD 02/09/20 1224

## 2020-02-09 NOTE — ED Triage Notes (Signed)
Pt c/o left ear pain since last weekend. Feels "full" of fluid and tender to touch. OTC ear drops and self flushing tried.

## 2020-04-14 ENCOUNTER — Other Ambulatory Visit (HOSPITAL_COMMUNITY): Payer: Self-pay | Admitting: Psychiatry

## 2020-05-18 ENCOUNTER — Other Ambulatory Visit (HOSPITAL_COMMUNITY): Payer: Self-pay | Admitting: Psychiatry

## 2020-06-09 ENCOUNTER — Encounter (HOSPITAL_COMMUNITY): Payer: Self-pay

## 2020-06-09 ENCOUNTER — Ambulatory Visit (HOSPITAL_COMMUNITY)
Admission: EM | Admit: 2020-06-09 | Discharge: 2020-06-09 | Disposition: A | Discharge status: Home / Self Care | Payer: BC Managed Care – PPO | Attending: Physician Assistant | Admitting: Physician Assistant

## 2020-06-09 ENCOUNTER — Other Ambulatory Visit: Payer: Self-pay

## 2020-06-09 DIAGNOSIS — R112 Nausea with vomiting, unspecified: Secondary | ICD-10-CM | POA: Diagnosis not present

## 2020-06-09 DIAGNOSIS — R109 Unspecified abdominal pain: Secondary | ICD-10-CM

## 2020-06-09 DIAGNOSIS — R1030 Lower abdominal pain, unspecified: Secondary | ICD-10-CM | POA: Diagnosis not present

## 2020-06-09 DIAGNOSIS — R197 Diarrhea, unspecified: Secondary | ICD-10-CM

## 2020-06-09 LAB — POCT URINALYSIS DIPSTICK, ED / UC
Glucose, UA: NEGATIVE mg/dL
Hgb urine dipstick: NEGATIVE
Ketones, ur: >=160 mg/dL — AB
Leukocytes,Ua: NEGATIVE
Nitrite: NEGATIVE
Protein, ur: NEGATIVE mg/dL
Specific Gravity, Urine: >=1.03 (ref 1.005–1.030)
Urobilinogen, UA: 0.2 mg/dL (ref 0.0–1.0)
pH: 5.5 (ref 5.0–8.0)

## 2020-06-09 LAB — POC URINE PREG, ED: Preg Test, Ur: NEGATIVE

## 2020-06-09 MED ORDER — DICYCLOMINE HCL 20 MG PO TABS: 20.0000 mg | ORAL_TABLET | Freq: Two times a day (BID) | ORAL | 0 refills | Status: DC

## 2020-06-09 NOTE — ED Provider Notes (Signed)
[facility]    CSN: [id number] Arrival date & time: 06/09/20  1320      History   Chief Complaint Chief Complaint  Patient presents with  . Abdominal Pain    HPI Melanie Meyer is a 25 y.o. female.   Patient presents today with a 2-day history of intermittent abdominal pain.  During episodes pain is rated 7 on a 0-10 pain scale, localized to left abdomen without radiation, described as cramping, no aggravating or alleviating factors identified.  She has not tried any over-the-counter medications.  She reports symptoms seem to be triggered by oral consumption particularly food.  She denies history of gastrointestinal disorder or previous abdominal surgery.  She denies any medication changes recent antibiotic use.  Denies recent travel or suspicious food intake.  Reports associated diarrhea which she describes as 4-5 watery bowel movements per day without blood or mucus.  She reports associated nausea but denies any vomiting, fever, urinary symptoms, dizziness, headache.  She denies any recent illness.  Denies any known sick contacts.     Past Medical History:  Diagnosis Date  . Anxiety   . Depression   . Frequent headaches     Patient Active Problem List   Diagnosis Date Noted  . Major depression single episode, in partial remission (HCC) 03/27/2018  . Generalized anxiety disorder 03/27/2018  . Dysmenorrhea in adolescent 10/25/2013  . Headache 11/17/2012  . ANKLE SPRAIN, RIGHT 06/03/2009    History reviewed. No pertinent surgical history.  OB History   No obstetric history on file.      Home Medications    Prior to Admission medications   Medication Sig Start Date End Date Taking? Authorizing Provider  dicyclomine (BENTYL) 20 MG tablet Take 1 tablet (20 mg total) by mouth 2 (two) times daily. 06/09/20  Yes Greyden Besecker K, PA-C  busPIRone (BUSPAR) 10 MG tablet TAKE 1 TABLET BY MOUTH TWICE A DAY 09/30/19   Oletta Darter, MD   ciprofloxacin-dexamethasone [facility]) OTIC suspension Place 4 drops into the left ear 2 (two) times daily. X 7 days Patient not taking: No sig reported 03/12/19   Domenick Gong, MD  cyclobenzaprine (FLEXERIL) 10 MG tablet Take 1 tablet (10 mg total) by mouth 3 (three) times daily as needed for muscle spasms. Patient not taking: No sig reported 03/12/19   Domenick Gong, MD  EPIDUO FORTE 0.3-2.5 % GEL  04/12/18   [provider]  fluticasone (CUTIVATE) 0.05 % cream APPLY TO AFFECTED AREA EVERY DAY 03/31/18   [provider]  ibuprofen (ADVIL) 600 MG tablet Take 1 tablet (600 mg total) by mouth every 6 (six) hours as needed. 03/12/19   Domenick Gong, MD  norethindrone-ethinyl estradiol (JUNEL FE,GILDESS FE,LOESTRIN FE) 1-20 MG-MCG tablet Take 1 tablet by mouth daily.    [provider]  PARoxetine (PAXIL) 20 MG tablet TAKE 1 TABLET BY MOUTH EVERY DAY 11/07/19   Oletta Darter, MD  rizatriptan (MAXALT) 5 MG tablet TAKE 1 TABLET (5 MG TOTAL) BY MOUTH AS NEEDED FOR MIGRAINE. MAY REPEAT IN 2 HOURS IF NEEDED 09/27/14   [provider]  tacrolimus (PROTOPIC) 0.03 % ointment APPLY TO AFFECTED AREA TWICE A DAY 03/31/18   [provider]  triamcinolone cream (KENALOG) 0.1 % Apply to atopic dermatitis on ears and neck 1-2 times a day as needed 02/16/17   [provider]    Family History Family History  Problem Relation Age of Onset  . Depression Sister   . Anxiety disorder Sister   .  Depression Brother   . Anxiety disorder Brother     Social History Social History   Tobacco Use  . Smoking status: Never Smoker  . Smokeless tobacco: Never Used  Vaping Use  . Vaping Use: Never used  Substance Use Topics  . Alcohol use: Yes  . Drug use: No     Allergies   Patient has no known allergies.   Review of Systems Review of Systems  Constitutional: Positive for activity change and appetite change. Negative for fatigue and fever.   Respiratory: Negative for cough and shortness of breath.   Cardiovascular: Negative for chest pain.  Gastrointestinal: Positive for abdominal pain, diarrhea and nausea. Negative for blood in stool and vomiting.  Genitourinary: Negative for dysuria, frequency, urgency, vaginal bleeding, vaginal discharge and vaginal pain.  Musculoskeletal: Negative for arthralgias and myalgias.  Neurological: Negative for dizziness, light-headedness and headaches.     Physical Exam Triage Vital Signs ED Triage Vitals  Enc Vitals Group     BP 06/09/20 1401 (!) 123/32     Pulse Rate 06/09/20 1401 96     Resp 06/09/20 1401 20     Temp 06/09/20 1401 99 F (37.2 C)     Temp Source 06/09/20 1401 Oral     SpO2 06/09/20 1401 100 %     Weight --      Height --      Head Circumference --      Peak Flow --      Pain Score 06/09/20 1400 7     Pain Loc --      Pain Edu? --      Excl. in GC? --    No data found.  Updated Vital Signs BP 123/82 (BP Location: Right Arm)   Pulse 96   Temp 99 F (37.2 C) (Oral)   Resp 20   LMP  (Within Weeks) Comment: 3 weeks  SpO2 100%   Visual Acuity Right Eye Distance:   Left Eye Distance:   Bilateral Distance:    Right Eye Near:   Left Eye Near:    Bilateral Near:     Physical Exam Vitals reviewed.  Constitutional:      General: She is awake. She is not in acute distress.    Appearance: Normal appearance. She is not ill-appearing.     Comments: Very pleasant female appears stated age in no acute distress  HENT:     Head: Normocephalic and atraumatic.  Cardiovascular:     Rate and Rhythm: Normal rate and regular rhythm.     Heart sounds: No murmur heard.   Pulmonary:     Effort: Pulmonary effort is normal.     Breath sounds: Normal breath sounds. No wheezing, rhonchi or rales.     Comments: Clear to auscultation bilaterally Abdominal:     General: Bowel sounds are normal.     Palpations: Abdomen is soft.     Tenderness: There is no abdominal  tenderness. There is no right CVA tenderness, left CVA tenderness, guarding or rebound.     Comments: Benign abdominal exam; no tenderness to palpation on exam.  No CVA tenderness.  Psychiatric:        Behavior: Behavior is cooperative.      UC Treatments / Results  Labs (all labs ordered are listed, but only abnormal results are displayed) Labs Reviewed  POCT URINALYSIS DIPSTICK, ED / UC - Abnormal; Notable for the following components:      Result Value   Bilirubin Urine  SMALL (*)    Ketones, ur >=160 (*)    All other components within normal limits  POC URINE PREG, ED    EKG   Radiology No results found.  Procedures Procedures (including critical care time)  Medications Ordered in UC Medications - No data to display  Initial Impression / Assessment and Plan / UC Course  I have reviewed the triage vital signs and the nursing notes.  Pertinent labs & imaging results that were available during my care of the patient were reviewed by me and considered in my medical decision making (see chart for details).     Patient is nontender on exam and vital signs are stable.  Patient prescribed Bentyl to help manage symptoms.  She was encouraged to eat a bland diet and drink plenty of fluid.  Discussed that we are unable to obtain abdominal imaging and if she has persistent and/or recurrent symptoms she needs to be reevaluated to consider CT scan.  Strict return precautions given to which patient expressed understanding.  Final Clinical Impressions(s) / UC Diagnoses   Final diagnoses:  Lower abdominal pain  Nausea and vomiting, intractability of vomiting not specified, unspecified vomiting type  Diarrhea, unspecified type  Abdominal cramping     Discharge Instructions     Take Bentyl twice a day to help with abdominal cramping.  Eat a bland diet as we discussed.  Make sure you are drinking plenty of fluid.  If you have persistent symptoms or worsening symptoms you need to be  reevaluated to consider CT scan as we discussed.  Please return if anything changes.    ED Prescriptions    Medication Sig Dispense Auth. Provider   dicyclomine (BENTYL) 20 MG tablet Take 1 tablet (20 mg total) by mouth 2 (two) times daily. 20 tablet Ryana Montecalvo, Noberto Retort, PA-C     PDMP not reviewed this encounter.   Jeani Hawking, PA-C 06/09/20 1456

## 2020-06-09 NOTE — ED Triage Notes (Signed)
Pt presents with  Left lower quadrant pain, diarrhea, chills, low appetite x 2 days. Pain is worse after eats.

## 2020-06-09 NOTE — Discharge Instructions (Addendum)
Take Bentyl twice a day to help with abdominal cramping.  Eat a bland diet as we discussed.  Make sure you are drinking plenty of fluid.  If you have persistent symptoms or worsening symptoms you need to be reevaluated to consider CT scan as we discussed.  Please return if anything changes.

## 2020-08-28 ENCOUNTER — Other Ambulatory Visit: Payer: Self-pay

## 2020-08-28 ENCOUNTER — Emergency Department
Admission: EM | Admit: 2020-08-28 | Discharge: 2020-08-28 | Disposition: A | Discharge status: Home / Self Care | Payer: BC Managed Care – PPO | Source: Home / Self Care

## 2020-08-28 DIAGNOSIS — H6123 Impacted cerumen, bilateral: Secondary | ICD-10-CM | POA: Diagnosis not present

## 2020-08-28 DIAGNOSIS — H6122 Impacted cerumen, left ear: Secondary | ICD-10-CM | POA: Diagnosis not present

## 2020-08-28 DIAGNOSIS — H60392 Other infective otitis externa, left ear: Secondary | ICD-10-CM

## 2020-08-28 MED ORDER — OFLOXACIN 0.3 % OT SOLN: 5.0000 [drp] | Freq: Every day | OTIC | 0 refills | Status: DC

## 2020-08-28 NOTE — Discharge Instructions (Addendum)
We remove the wax but it so is that you likely have an external ear infection.  Please start ciprofloxacin drops daily in your left ear.  Make sure that you keep your head facing upward for several minutes after applying drops to ensure they get all the way through the ear canal.  If you have any worsening or persistent symptoms follow-up with your ear nose and throat doctor.  Avoid submerging her head in water.  You can use Tylenol ibuprofen for pain relief.

## 2020-08-28 NOTE — ED Triage Notes (Signed)
Pt c/o LT ear fullness x 1 week. Hx of cerumen impactions. Tried at home lavage with little relief. As well as er wax softening drops.

## 2020-08-28 NOTE — ED Provider Notes (Signed)
[facility]    CSN: [id number] Arrival date & time: 08/28/20  1358      History   Chief Complaint Chief Complaint  Patient presents with   Ear Problem    LT    HPI Melanie Meyer is a 25 y.o. female.   Patient presents today with a weeklong history of worsening decreased hearing.  She has a history of recurrent cerumen impaction is typically followed by ENT.  She typically sees them once every 3 to 6 months but has not seen them recently as she had difficulty scheduling appointment.  She has tried over-the-counter ceruminolytic medication without improvement of symptoms.  She denies any otalgia, otorrhea, fever, congestion symptoms.  She denies any recent swimming or airplane travel.  She does not use anything in her ears such as Q-tips, earplugs, earplugs.  Reports symptoms are similar to previous episodes of cerumen impaction.   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 No pertinent surgical history.  OB History   No obstetric history on file.      Home Medications    Prior to Admission medications   Medication Sig Start Date End Date Taking? Authorizing Provider  ofloxacin (FLOXIN) 0.3 % OTIC solution Place 5 drops into the left ear daily. 08/28/20  Yes Lubna Stegeman K, PA-C  ADDERALL XR 10 MG 24 hr capsule Take 10 mg by mouth every morning. 06/29/20   [provider]  busPIRone (BUSPAR) 10 MG tablet TAKE 1 TABLET BY MOUTH TWICE A DAY 09/30/19   Oletta Darter, MD  cyclobenzaprine (FLEXERIL) 10 MG tablet Take 1 tablet (10 mg total) by mouth 3 (three) times daily as needed for muscle spasms. Patient not taking: No  sig reported 03/12/19   Domenick Gong, MD  dicyclomine (BENTYL) 20 MG tablet Take 1 tablet (20 mg total) by mouth 2 (two) times daily. 06/09/20   Berkeley Vanaken, Noberto Retort, PA-C  EPIDUO FORTE 0.3-2.5 % GEL  04/12/18   [provider]  fluticasone (CUTIVATE) 0.05 % cream APPLY TO AFFECTED AREA EVERY DAY 03/31/18   [provider]  ibuprofen (ADVIL) 600 MG tablet Take 1 tablet (600 mg total) by mouth every 6 (six) hours as needed. 03/12/19   Domenick Gong, MD  PARoxetine (PAXIL) 20 MG tablet TAKE 1 TABLET BY MOUTH EVERY DAY 11/07/19   Oletta Darter, MD  rizatriptan (MAXALT) 5 MG tablet TAKE 1 TABLET (5 MG TOTAL) BY MOUTH AS NEEDED FOR MIGRAINE. MAY REPEAT IN 2 HOURS IF NEEDED 09/27/14   [provider]  tacrolimus (PROTOPIC) 0.03 % ointment APPLY TO AFFECTED AREA TWICE A DAY 03/31/18   [provider]  triamcinolone cream (KENALOG) 0.1 % Apply to atopic dermatitis on ears and neck 1-2 times a day as needed 02/16/17   [provider]    Family History Family History  Problem Relation Age of Onset   Depression Sister    Anxiety disorder Sister    Depression Brother    Anxiety disorder Brother     Social History Social History   Tobacco Use   Smoking status: Never   Smokeless tobacco: Never  Vaping Use  Vaping Use: Never used  Substance Use Topics   Alcohol use: Yes   Drug use: No     Allergies   Patient has no known allergies.   Review of Systems Review of Systems  Constitutional:  Negative for activity change, appetite change, fatigue and fever.  HENT:  Positive for hearing loss. Negative for congestion, ear discharge, ear pain, sinus pressure, sneezing and sore throat.   Respiratory:  Negative for cough and shortness of breath.   Cardiovascular:  Negative for chest pain.  Gastrointestinal:  Negative for abdominal pain, diarrhea, nausea and vomiting.  Neurological:  Negative for dizziness, light-headedness and headaches.    Physical  Exam Triage Vital Signs ED Triage Vitals  Enc Vitals Group     BP 08/28/20 1407 118/79     Pulse Rate 08/28/20 1407 (!) 109     Resp 08/28/20 1407 18     Temp 08/28/20 1407 98.8 F (37.1 C)     Temp Source 08/28/20 1407 Oral     SpO2 08/28/20 1407 99 %     Weight --      Height --      Head Circumference --      Peak Flow --      Pain Score 08/28/20 1408 0     Pain Loc --      Pain Edu? --      Excl. in GC? --    No data found.  Updated Vital Signs BP 118/79 (BP Location: Left Arm)   Pulse (!) 109   Temp 98.8 F (37.1 C) (Oral)   Resp 18   LMP 08/17/2020 (Approximate)   SpO2 99%   Visual Acuity Right Eye Distance:   Left Eye Distance:   Bilateral Distance:    Right Eye Near:   Left Eye Near:    Bilateral Near:     Physical Exam Vitals reviewed.  Constitutional:      General: She is awake. She is not in acute distress.    Appearance: Normal appearance. She is normal weight. She is not ill-appearing.     Comments: Very pleasant female appears stated age in no acute distress sitting comfortably in exam room  HENT:     Head: Normocephalic and atraumatic.     Right Ear: Tympanic membrane, ear canal and external ear normal. There is impacted cerumen. Tympanic membrane is not erythematous or bulging.     Left Ear: Tympanic membrane, ear canal and external ear normal. Tenderness present. There is impacted cerumen. Tympanic membrane is not erythematous or bulging.     Ears:     Comments: Treatment impaction noted bilaterally resolved with in office irrigation revealing normal TM on the right and erythematous ear canal and pain with manipulation of external ear/palpation of tragus on the left but normal-appearing TM    Nose: Nose normal.     Mouth/Throat:     Pharynx: Uvula midline. No oropharyngeal exudate or posterior oropharyngeal erythema.  Cardiovascular:     Rate and Rhythm: Normal rate and regular rhythm.     Heart sounds: Normal heart sounds, S1 normal and S2  normal. No murmur heard. Pulmonary:     Effort: Pulmonary effort is normal.     Breath sounds: Normal breath sounds. No wheezing, rhonchi or rales.  Psychiatric:        Behavior: Behavior is cooperative.     UC Treatments / Results  Labs (all labs ordered are listed, but only abnormal results are displayed) Labs Reviewed -  No data to display  EKG   Radiology No results found.  Procedures Procedures (including critical care time)  Medications Ordered in UC Medications - No data to display  Initial Impression / Assessment and Plan / UC Course  I have reviewed the triage vital signs and the nursing notes.  Pertinent labs & imaging results that were available during my care of the patient were reviewed by me and considered in my medical decision making (see chart for details).      Cerumen impaction resolved with in office irrigation.  Otitis externa identified on left.  Patient started on ciprofloxacin drops with instruction to keep head facing upward for several minutes after use of drops to ensure complete penetration throughout ear canal.  Recommend she avoid submerging her head in water particularly while she goes on vacation to the beach next week.  She can use over-the-counter analgesics for pain relief.  Recommend she follow-up with ear nose and throat if symptoms do not significantly improve.  Discussed alarm symptoms that warrant emergent evaluation.  Strict return precautions given to which patient expressed understanding.  Final Clinical Impressions(s) / UC Diagnoses   Final diagnoses:  Bilateral impacted cerumen  Hearing loss of left ear due to cerumen impaction  Infective otitis externa of left ear     Discharge Instructions      We remove the wax but it so is that you likely have an external ear infection.  Please start ciprofloxacin drops daily in your left ear.  Make sure that you keep your head facing upward for several minutes after applying drops to  ensure they get all the way through the ear canal.  If you have any worsening or persistent symptoms follow-up with your ear nose and throat doctor.  Avoid submerging her head in water.  You can use Tylenol ibuprofen for pain relief.     ED Prescriptions     Medication Sig Dispense Auth. Provider   ofloxacin (FLOXIN) 0.3 % OTIC solution Place 5 drops into the left ear daily. 5 mL Duvan Mousel K, PA-C      PDMP not reviewed this encounter.   Jeani Hawking, PA-C 08/28/20 1440

## 2021-02-03 ENCOUNTER — Emergency Department
Admission: RE | Admit: 2021-02-03 | Discharge: 2021-02-03 | Disposition: A | Discharge status: Home / Self Care | Payer: BC Managed Care – PPO | Source: Ambulatory Visit

## 2021-02-03 ENCOUNTER — Other Ambulatory Visit: Payer: Self-pay

## 2021-02-03 VITALS — BP 112/77 | HR 81 | Temp 98.6°F | Resp 20 | Ht 61.0 in | Wt 110.0 lb

## 2021-02-03 DIAGNOSIS — H6123 Impacted cerumen, bilateral: Secondary | ICD-10-CM

## 2021-02-03 NOTE — Discharge Instructions (Addendum)
Advised/instructed patient to follow-up with ENT for further earwax removal.  Advised patient to use OTC Debrox-4 drops twice daily into bilateral EACs for 5 days.

## 2021-02-03 NOTE — ED Triage Notes (Signed)
Pt states that her left ear is clogged. X1 week  Pt states that she has been using ear drops but nothing is helping.   Pt states that this happens to her every year. Pt states that she normally sees an ENT but couldn't get an appt sooner.

## 2021-02-03 NOTE — ED Provider Notes (Signed)
[facility]    CSN: [id number] Arrival date & time: 02/03/21  1045      History   Chief Complaint Chief Complaint  Patient presents with   Ear Problem    Left ear is clogged. X1 week    HPI Melanie Meyer is a 25 y.o. female.   HPI 25 year old female presents with left ear fullness/clogged for 1 week.  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 No pertinent surgical history.  OB History   No obstetric history on file.      Home Medications    Prior to Admission medications   Medication Sig Start Date End Date Taking? Authorizing Provider  ADDERALL XR 10 MG 24 hr capsule Take 10 mg by mouth every morning. 06/29/20  Yes [provider]  busPIRone (BUSPAR) 10 MG tablet TAKE 1 TABLET BY MOUTH TWICE A DAY 09/30/19  Yes Oletta Darter, MD  cyclobenzaprine (FLEXERIL) 10 MG tablet Take 1 tablet (10 mg total) by mouth 3 (three) times daily as needed for muscle spasms. 03/12/19  Yes Domenick Gong, MD  dicyclomine (BENTYL) 20 MG tablet Take 1 tablet (20 mg total) by mouth 2 (two) times daily. 06/09/20  Yes Raspet, Erin K, PA-C  EPIDUO FORTE 0.3-2.5 % GEL  04/12/18  Yes [provider]  fluticasone (CUTIVATE) 0.05 % cream APPLY TO AFFECTED AREA EVERY DAY 03/31/18  Yes [provider]  ibuprofen (ADVIL) 600 MG tablet Take 1 tablet (600 mg total) by mouth every 6 (six) hours as needed. 03/12/19  Yes Domenick Gong, MD  ofloxacin (FLOXIN) 0.3 % OTIC solution Place 5 drops into the left ear daily. 08/28/20  Yes Raspet, Erin K, PA-C  PARoxetine (PAXIL) 20 MG tablet TAKE 1 TABLET BY MOUTH EVERY DAY 11/07/19  Yes  Oletta Darter, MD  rizatriptan (MAXALT) 5 MG tablet TAKE 1 TABLET (5 MG TOTAL) BY MOUTH AS NEEDED FOR MIGRAINE. MAY REPEAT IN 2 HOURS IF NEEDED 09/27/14  Yes [provider]  tacrolimus (PROTOPIC) 0.03 % ointment APPLY TO AFFECTED AREA TWICE A DAY 03/31/18  Yes [provider]  triamcinolone cream (KENALOG) 0.1 % Apply to atopic dermatitis on ears and neck 1-2 times a day as needed 02/16/17  Yes [provider]    Family History Family History  Problem Relation Age of Onset   Depression Sister    Anxiety disorder Sister    Depression Brother    Anxiety disorder Brother     Social History Social History   Tobacco Use   Smoking status: Never   Smokeless tobacco: Never  Vaping Use   Vaping Use: Never used  Substance Use Topics   Alcohol use: Yes    Comment: occ   Drug use: No     Allergies   Patient has no known allergies.   Review of Systems Review of Systems  HENT:         Ear fullness x1 week    Physical Exam Triage Vital Signs ED Triage Vitals  Enc Vitals Group     BP 02/03/21 1114 112/77     Pulse Rate 02/03/21 1114 81  Resp 02/03/21 1114 20     Temp 02/03/21 1114 98.6 F (37 C)     Temp src --      SpO2 02/03/21 1114 99 %     Weight 02/03/21 1111 110 lb (49.9 kg)     Height 02/03/21 1111 5\' 1"  (1.549 m)     Head Circumference --      Peak Flow --      Pain Score 02/03/21 1111 0     Pain Loc --      Pain Edu? --      Excl. in GC? --    No data found.  Updated Vital Signs BP 112/77 (BP Location: Left Arm)    Pulse 81    Temp 98.6 F (37 C)    Resp 20    Ht 5\' 1"  (1.549 m)    Wt 110 lb (49.9 kg)    LMP 12/30/2020    SpO2 99%    BMI 20.78 kg/m   Physical Exam Vitals and nursing note reviewed.  Constitutional:      General: She is not in acute distress.    Appearance: Normal appearance. She is normal weight. She is not ill-appearing.  HENT:     Head: Normocephalic and atraumatic.     Right Ear: External ear normal.      Left Ear: External ear normal.     Ears:     Comments: Bilateral EACs occluded with cerumen unable to visualize either TM  Post bilateral EAC lavage: Mild to moderate cerumen removed; however unable to visualize either TM completely    Mouth/Throat:     Mouth: Mucous membranes are moist.     Pharynx: Oropharynx is clear.  Eyes:     Extraocular Movements: Extraocular movements intact.     Pupils: Pupils are equal, round, and reactive to light.  Cardiovascular:     Rate and Rhythm: Normal rate and regular rhythm.     Pulses: Normal pulses.     Heart sounds: Normal heart sounds.  Pulmonary:     Effort: Pulmonary effort is normal.     Breath sounds: Normal breath sounds.  Musculoskeletal:     Cervical back: Normal range of motion and neck supple.  Skin:    General: Skin is warm and dry.  Neurological:     General: No focal deficit present.     Mental Status: She is alert and oriented to person, place, and time.     UC Treatments / Results  Labs (all labs ordered are listed, but only abnormal results are displayed) Labs Reviewed - No data to display  EKG   Radiology No results found.  Procedures Procedures (including critical care time)  Medications Ordered in UC Medications - No data to display  Initial Impression / Assessment and Plan / UC Course  I have reviewed the triage vital signs and the nursing notes.  Pertinent labs & imaging results that were available during my care of the patient were reviewed by me and considered in my medical decision making (see chart for details).     MDM: 1. Bilateral impacted cerumen-Advised/instructed patient to follow-up with ENT for further earwax removal.  Advised patient to use OTC Debrox-4 drops twice daily into bilateral EACs for 5 days. Final Clinical Impressions(s) / UC Diagnoses   Final diagnoses:  Bilateral impacted cerumen     Discharge Instructions      Advised/instructed patient to follow-up with ENT for  further earwax removal.  Advised patient to  use OTC Debrox-4 drops twice daily into bilateral EACs for 5 days.     ED Prescriptions   None    PDMP not reviewed this encounter.   Trevor Iha, FNP 02/03/21 1326

## 2021-02-18 ENCOUNTER — Emergency Department
Admission: EM | Admit: 2021-02-18 | Discharge: 2021-02-18 | Disposition: A | Discharge status: Home / Self Care | Payer: BC Managed Care – PPO | Source: Home / Self Care | Attending: Family Medicine | Admitting: Family Medicine

## 2021-02-18 ENCOUNTER — Other Ambulatory Visit: Payer: Self-pay

## 2021-02-18 DIAGNOSIS — M542 Cervicalgia: Secondary | ICD-10-CM | POA: Diagnosis not present

## 2021-02-18 DIAGNOSIS — G43019 Migraine without aura, intractable, without status migrainosus: Secondary | ICD-10-CM | POA: Diagnosis not present

## 2021-02-18 LAB — POC SARS CORONAVIRUS 2 AG -  ED: SARS Coronavirus 2 Ag: NEGATIVE

## 2021-02-18 LAB — POC INFLUENZA A AND B ANTIGEN (URGENT CARE ONLY)
Influenza A Ag: NEGATIVE
Influenza B Ag: NEGATIVE

## 2021-02-18 MED ORDER — METHOCARBAMOL 500 MG PO TABS: 500.0000 mg | ORAL_TABLET | Freq: Three times a day (TID) | ORAL | 0 refills | Status: DC | PRN

## 2021-02-18 MED ORDER — KETOROLAC TROMETHAMINE 30 MG/ML IJ SOLN
30.0000 mg | Freq: Once | INTRAMUSCULAR | Status: AC
  Administered 2021-02-18: 30 mg via INTRAMUSCULAR

## 2021-02-18 MED ORDER — ONDANSETRON HCL 8 MG PO TABS: 8.0000 mg | ORAL_TABLET | Freq: Three times a day (TID) | ORAL | 0 refills | Status: DC | PRN

## 2021-02-18 NOTE — Discharge Instructions (Signed)
Go to ER if worse 

## 2021-02-18 NOTE — ED Provider Notes (Signed)
[facility]    CSN: [id number] Arrival date & time: 02/18/21  1420      History   Chief Complaint Chief Complaint  Patient presents with   Neck Pain   Headache    HPI Melanie Meyer is a 26 y.o. female.   HPI  Patient has a long history of migraines.  She takes multiple multiple medications for her migraines.  She currently has a left-sided headache.  Pressure behind the left ear.  Pain in the left neck.  Photophobia.  She has taken some over-the-counter medicine.  She took some acetaminophen.  In spite of this she continues to have headache.  No cough or cold symptoms.  Her sister is home with COVID.  No fever or chills.  No fall, trauma, injury.  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 No pertinent surgical history.  OB History   No obstetric history on file.      Home Medications    Prior to Admission medications   Medication Sig Start Date End Date Taking? Authorizing Provider  methocarbamol (ROBAXIN) 500 MG tablet Take 1 tablet (500 mg total) by mouth every 8 (eight) hours as needed for muscle spasms. 02/18/21  Yes Eustace MooreNelson, Johnney Scarlata Sue, MD  ondansetron (ZOFRAN) 8 MG tablet Take 1 tablet (8 mg total) by mouth every 8 (eight) hours as needed for nausea or vomiting. 02/18/21  Yes Eustace MooreNelson, Aniya Jolicoeur Sue, MD  ADDERALL XR 10 MG 24 hr capsule Take 10 mg by mouth every morning. 06/29/20   [provider]  busPIRone (BUSPAR) 10 MG tablet TAKE 1 TABLET BY MOUTH TWICE A DAY 09/30/19   Oletta DarterAgarwal, [place], MD  dicyclomine (BENTYL) 20 MG tablet Take 1 tablet (20 mg total) by mouth 2 (two) times daily. 06/09/20   Raspet, Noberto RetortErin K, PA-C   EPIDUO FORTE 0.3-2.5 % GEL  04/12/18   [provider]  fluticasone (CUTIVATE) 0.05 % cream APPLY TO AFFECTED AREA EVERY DAY 03/31/18   [provider]  PARoxetine (PAXIL) 20 MG tablet TAKE 1 TABLET BY MOUTH EVERY DAY 11/07/19   Oletta DarterAgarwal, [place], MD  rizatriptan (MAXALT) 5 MG tablet TAKE 1 TABLET (5 MG TOTAL) BY MOUTH AS NEEDED FOR MIGRAINE. MAY REPEAT IN 2 HOURS IF NEEDED 09/27/14   [provider]  tacrolimus (PROTOPIC) 0.03 % ointment APPLY TO AFFECTED AREA TWICE A DAY 03/31/18   [provider]  triamcinolone cream (KENALOG) 0.1 % Apply to atopic dermatitis on ears and neck 1-2 times a day as needed 02/16/17   [provider]    Family History Family History  Problem Relation Age of Onset   Depression Sister    Anxiety disorder Sister    Depression Brother    Anxiety disorder Brother     Social History Social History   Tobacco Use   Smoking status: Never   Smokeless tobacco: Never  Vaping Use   Vaping Use: Never used  Substance Use Topics   Alcohol use: Yes    Comment: occ   Drug use: No     Allergies   Patient has no known allergies.   Review of Systems Review  of Systems See HPI  Physical Exam Triage Vital Signs ED Triage Vitals  Enc Vitals Group     BP --      Pulse Rate 02/18/21 1514 98     Resp 02/18/21 1514 20     Temp 02/18/21 1514 98.3 F (36.8 C)     Temp Source 02/18/21 1514 Oral     SpO2 02/18/21 1514 100 %     Weight --      Height --      Head Circumference --      Peak Flow --      Pain Score 02/18/21 1512 6     Pain Loc --      Pain Edu? --      Excl. in GC? --    No data found.  Updated Vital Signs Pulse 98    Temp 98.3 F (36.8 C) (Oral)    Resp 20    SpO2 100%      Physical Exam Constitutional:      General: She is in acute distress.     Appearance: She is well-developed and normal weight.     Comments: Appears appears uncomfortable.  Lucid with normal mentation  HENT:     Head:  Normocephalic and atraumatic.  Eyes:     General: No visual field deficit.    Extraocular Movements: Extraocular movements intact.     Right eye: Normal extraocular motion and no nystagmus.     Left eye: Normal extraocular motion and no nystagmus.     Conjunctiva/sclera: Conjunctivae normal.     Pupils: Pupils are equal, round, and reactive to light.  Cardiovascular:     Rate and Rhythm: Normal rate.  Pulmonary:     Effort: Pulmonary effort is normal. No respiratory distress.  Abdominal:     General: There is no distension.     Palpations: Abdomen is soft.  Musculoskeletal:        General: Normal range of motion.     Cervical back: Normal range of motion.     Comments: Tenderness in the left neck paraspinous muscles.  Full neck range of motion  Skin:    General: Skin is warm and dry.  Neurological:     Mental Status: She is alert. Mental status is at baseline.     Cranial Nerves: No cranial nerve deficit, dysarthria or facial asymmetry.     UC Treatments / Results  Labs (all labs ordered are listed, but only abnormal results are displayed) Labs Reviewed  POC SARS CORONAVIRUS 2 AG -  ED  POC INFLUENZA A AND B ANTIGEN (URGENT CARE ONLY)    EKG   Radiology No results found.  Procedures Procedures (including critical care time)  Medications Ordered in UC Medications  ketorolac (TORADOL) 30 MG/ML injection 30 mg (30 mg Intramuscular Given 02/18/21 1616)    Initial Impression / Assessment and Plan / UC Course  I have reviewed the triage vital signs and the nursing notes.  Pertinent labs & imaging results that were available during my care of the patient were reviewed by me and considered in my medical decision making (see chart for details).     No indicator of serious illness or meningitis.  I feel like this is a migraine.  She did get improvement from the Toradol injection.  COVID and flu testing are negative Final Clinical Impressions(s) / UC Diagnoses   Final  diagnoses:  Neck pain  Intractable migraine without aura and without status migrainosus  Discharge Instructions      Go to ER if worse   ED Prescriptions     Medication Sig Dispense Auth. Provider   ondansetron (ZOFRAN) 8 MG tablet Take 1 tablet (8 mg total) by mouth every 8 (eight) hours as needed for nausea or vomiting. 20 tablet Eustace Moore, MD   methocarbamol (ROBAXIN) 500 MG tablet Take 1 tablet (500 mg total) by mouth every 8 (eight) hours as needed for muscle spasms. 20 tablet Eustace Moore, MD      PDMP not reviewed this encounter.   Eustace Moore, MD 02/18/21 [phone]

## 2021-02-18 NOTE — ED Triage Notes (Signed)
Pt c/o head and neck pain x 2-3 days. Says it hurts behind her ears causing headache and sensitivity to light. Some intermittent nausea. Zofran last night. Tylenol at 130pm. Hx of migraines, but this feels different she says.

## 2021-07-16 ENCOUNTER — Emergency Department
Admission: EM | Admit: 2021-07-16 | Discharge: 2021-07-16 | Disposition: A | Discharge status: Home / Self Care | Payer: BC Managed Care – PPO | Source: Home / Self Care

## 2021-07-16 ENCOUNTER — Encounter: Payer: Self-pay | Admitting: Emergency Medicine

## 2021-07-16 DIAGNOSIS — S161XXA Strain of muscle, fascia and tendon at neck level, initial encounter: Secondary | ICD-10-CM | POA: Diagnosis not present

## 2021-07-16 DIAGNOSIS — M62838 Other muscle spasm: Secondary | ICD-10-CM | POA: Diagnosis not present

## 2021-07-16 MED ORDER — BACLOFEN 10 MG PO TABS: 10.0000 mg | ORAL_TABLET | Freq: Three times a day (TID) | ORAL | 0 refills | Status: DC

## 2021-07-16 MED ORDER — BACLOFEN 10 MG PO TABS: 10.0000 mg | ORAL_TABLET | Freq: Three times a day (TID) | ORAL | 0 refills | Status: AC

## 2021-07-16 NOTE — ED Triage Notes (Signed)
Left sided neck pain since Sunday  Pt  wonders if she pinched a nerve  Causes a H/A Here with mom  OTC meds - excedrin today, muscle relaxer last night and today  Took H/A medication on Sunday - no relief Denies sore throat or pain w/ swallowing

## 2021-07-16 NOTE — ED Provider Notes (Signed)
[facility]    CSN: [id number] Arrival date & time: 07/16/21  1910      History   Chief Complaint Chief Complaint  Patient presents with   Neck Pain    Left side    HPI Melanie Meyer is a 26 y.o. female.   HPI 26 year old female presents with neck pain for 2-3 days.  Patient denies injury or insult to the neck.  Patient is accompanied by her Mother this evening.  PMH significant for frequent headaches and GAD.   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 No pertinent surgical history.  OB History   No obstetric history on file.      Home Medications    Prior to Admission medications   Medication Sig Start Date End Date Taking? Authorizing Provider  baclofen (LIORESAL) 10 MG tablet Take 1 tablet (10 mg total) by mouth 3 (three) times daily. 07/16/21  Yes Trevor Ihaagan, Stepfon Rawles, FNP  baclofen (LIORESAL) 10 MG tablet Take 1 tablet (10 mg total) by mouth 3 (three) times daily. 07/16/21  Yes Trevor Ihaagan, Opie Fanton, FNP  ADDERALL XR 10 MG 24 hr capsule Take 10 mg by mouth every morning. 06/29/20   [provider]  busPIRone (BUSPAR) 10 MG tablet TAKE 1 TABLET BY MOUTH TWICE A DAY 09/30/19   Oletta DarterAgarwal, [place], MD  dicyclomine (BENTYL) 20 MG tablet Take 1 tablet (20 mg total) by mouth 2 (two) times daily. 06/09/20   Raspet, Erin K, PA-C  DUPIXENT 300 MG/2ML SOPN Inject into the skin. 06/26/21   [provider]  EPIDUO FORTE 0.3-2.5 % GEL  04/12/18   [provider]  fluticasone (CUTIVATE) 0.05 % cream APPLY TO AFFECTED AREA EVERY DAY Patient not taking: Reported on 07/16/2021 03/31/18   [provider]  PARoxetine  (PAXIL) 20 MG tablet TAKE 1 TABLET BY MOUTH EVERY DAY 11/07/19   Oletta DarterAgarwal, [place], MD  rizatriptan (MAXALT) 5 MG tablet TAKE 1 TABLET (5 MG TOTAL) BY MOUTH AS NEEDED FOR MIGRAINE. MAY REPEAT IN 2 HOURS IF NEEDED 09/27/14   [provider]  tacrolimus (PROTOPIC) 0.03 % ointment APPLY TO AFFECTED AREA TWICE A DAY Patient not taking: Reported on 07/16/2021 03/31/18   [provider]  triamcinolone cream (KENALOG) 0.1 % Apply to atopic dermatitis on ears and neck 1-2 times a day as needed Patient not taking: Reported on 07/16/2021 02/16/17   [provider]    Family History Family History  Problem Relation Age of Onset   Depression Sister    Anxiety disorder Sister    Depression Brother    Anxiety disorder Brother     Social History Social History   Tobacco Use   Smoking status: Never   Smokeless tobacco: Never  Vaping Use   Vaping Use: Never used  Substance Use Topics   Alcohol use: Yes    Comment: occ   Drug use: No     Allergies   Patient has no known allergies.   Review of Systems Review of Systems  Musculoskeletal:  Positive for neck pain.  Neurological:  Positive for headaches.  All other systems reviewed and are negative.  Physical Exam Triage Vital Signs ED Triage Vitals  Enc Vitals Group     BP      Pulse      Resp      Temp      Temp src      SpO2      Weight      Height      Head Circumference      Peak Flow      Pain Score      Pain Loc      Pain Edu?      Excl. in GC?    No data found.  Updated Vital Signs BP 135/84 (BP Location: Left Arm)   Pulse 72   Temp 99.3 F (37.4 C) (Oral)   Resp 15   Ht 5\' 1"  (1.549 m)   Wt 123 lb (55.8 kg)   SpO2 100%   BMI 23.24 kg/m      Physical Exam Vitals and nursing note reviewed.  Constitutional:      Appearance: Normal appearance. She is normal weight.  HENT:     Head: Normocephalic and atraumatic.     Mouth/Throat:     Mouth: Mucous membranes are moist.      Pharynx: Oropharynx is clear.  Eyes:     Extraocular Movements: Extraocular movements intact.     Conjunctiva/sclera: Conjunctivae normal.     Pupils: Pupils are equal, round, and reactive to light.  Neck:     Comments: FROM with 4 planes of movement  Cardiovascular:     Rate and Rhythm: Normal rate and regular rhythm.     Pulses: Normal pulses.     Heart sounds: Normal heart sounds.  Pulmonary:     Effort: Pulmonary effort is normal.     Breath sounds: Normal breath sounds. No wheezing, rhonchi or rales.  Musculoskeletal:     Cervical back: Normal range of motion and neck supple.  Skin:    General: Skin is warm and dry.  Neurological:     General: No focal deficit present.     Mental Status: She is alert and oriented to person, place, and time. Mental status is at baseline.     UC Treatments / Results  Labs (all labs ordered are listed, but only abnormal results are displayed) Labs Reviewed - No data to display  EKG   Radiology No results found.  Procedures Procedures (including critical care time)  Medications Ordered in UC Medications - No data to display  Initial Impression / Assessment and Plan / UC Course  I have reviewed the triage vital signs and the nursing notes.  Pertinent labs & imaging results that were available during my care of the patient were reviewed by me and considered in my medical decision making (see chart for details).     MDM: 1. Cervical strain, initial encounter-Rx'd Baclofen. Advised patient may take Baclofen daily or as needed for cervical strain/muscle spasms of neck.  Encouraged patient to increase daily water intake while taking this medication.  Advised patient if symptoms worsen and/or unresolved please follow-up with PCP or here for further evaluation.  Final Clinical Impressions(s) / UC Diagnoses   Final diagnoses:  Muscle spasms of neck  Cervical strain, initial encounter     Discharge Instructions      Advised patient  may take Baclofen daily or as needed for cervical strain/muscle spasms of neck.  Encouraged patient to increase daily water intake while taking this medication.  Advised patient if symptoms  worsen and/or unresolved please follow-up with PCP or here for further evaluation.     ED Prescriptions     Medication Sig Dispense Auth. Provider   baclofen (LIORESAL) 10 MG tablet Take 1 tablet (10 mg total) by mouth 3 (three) times daily. 30 each Trevor Iha, FNP   baclofen (LIORESAL) 10 MG tablet Take 1 tablet (10 mg total) by mouth 3 (three) times daily. 30 each Trevor Iha, FNP      PDMP not reviewed this encounter.   Trevor Iha, FNP 07/16/21 1950

## 2021-07-16 NOTE — ED Notes (Signed)
Call to pharmacy on [address] to cancel baclofen- in process at [facility]

## 2021-07-16 NOTE — Discharge Instructions (Addendum)
Advised patient may take Baclofen daily or as needed for cervical strain/muscle spasms of neck.  Encouraged patient to increase daily water intake while taking this medication.  Advised patient if symptoms worsen and/or unresolved please follow-up with PCP or here for further evaluation.

## 2021-08-04 ENCOUNTER — Other Ambulatory Visit: Payer: Self-pay

## 2021-08-04 ENCOUNTER — Emergency Department
Admission: EM | Admit: 2021-08-04 | Discharge: 2021-08-04 | Disposition: A | Discharge status: Home / Self Care | Payer: BC Managed Care – PPO | Source: Home / Self Care

## 2021-08-04 DIAGNOSIS — R519 Headache, unspecified: Secondary | ICD-10-CM

## 2021-08-04 DIAGNOSIS — R110 Nausea: Secondary | ICD-10-CM

## 2021-08-04 HISTORY — DX: Attention-deficit hyperactivity disorder, unspecified type: F90.9

## 2021-08-04 MED ORDER — METOCLOPRAMIDE HCL 10 MG PO TABS: 10.0000 mg | ORAL_TABLET | Freq: Two times a day (BID) | ORAL | 0 refills | Status: DC | PRN

## 2021-08-04 MED ORDER — RIZATRIPTAN BENZOATE 5 MG PO TABS: ORAL_TABLET | ORAL | 0 refills | Status: AC

## 2021-08-04 NOTE — Discharge Instructions (Addendum)
Please follow-up with your primary care doctor to discuss a referral to a headache specialist vs a neurologist. Continue Baclofen previously prescribed, however when taking Maxalt allow at least 4 hours between medication as I suspect this may be causing nausea. I have prescribed metoclopramide for nausea and this can help with headache pain. I have refilled the Rizatriptan.  If you experience any severe, unrelenting headache pain that is unrelieved with the medications prescribed prior to following up with your primary care doctor go to the nearest emergency department.

## 2021-08-04 NOTE — ED Triage Notes (Addendum)
Pt presents to Urgent Care with c/o intermittent HA, posterior neck pain, nausea, and diarrhea x 2 days.

## 2021-08-04 NOTE — ED Provider Notes (Signed)
[facility]    CSN: [id number] Arrival date & time: 08/04/21  1230      History   Chief Complaint Chief Complaint  Patient presents with   Neck Pain   Headache   Nausea   Diarrhea    HPI Melanie Meyer is a 26 y.o. female.   HPI Patient presents today with recurrent headache, left occipital neck and head pain, nausea and diarrhea.  Patient was seen here in clinic on 07/16/21 prescribed baclofen and has been taking Maxalt in addition to the Baclofen without relief of pain.  She has been trying to get in with her primary care doctor as she has not had a formal evaluation by neurologist or headache specialist.  She reports having loose stools upon awakening in the morning time.  Denies any visual changes or dizziness associated with headaches.  Headache pain is only occurring at present in the lower occipital region and she has no longer having her typical frontal headaches which she has experienced in the past.  She is not presently having any headache pain.  Although she can reproduce the pain pressing on a certain area in the lower left occipital region of her head.  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 No pertinent surgical history.  OB History   No obstetric history on file.      Home Medications    Prior to Admission medications   Medication Sig Start Date End Date Taking? Authorizing Provider  anti-nausea (EMETROL) solution Take 10 mLs by mouth every 15 (fifteen) minutes as needed for nausea or vomiting.   Yes [provider]  metoCLOPramide (REGLAN) 10 MG tablet Take 1 tablet (10 mg total) by mouth  2 (two) times daily as needed for nausea or vomiting. 08/04/21  Yes Bing Neighbors, FNP  ADDERALL XR 10 MG 24 hr capsule Take 10 mg by mouth every morning. 06/29/20   [provider]  baclofen (LIORESAL) 10 MG tablet Take 1 tablet (10 mg total) by mouth 3 (three) times daily. 07/16/21   Trevor Iha, FNP  baclofen (LIORESAL) 10 MG tablet Take 1 tablet (10 mg total) by mouth 3 (three) times daily. 07/16/21   Trevor Iha, FNP  busPIRone (BUSPAR) 10 MG tablet TAKE 1 TABLET BY MOUTH TWICE A DAY 09/30/19   Oletta Darter, MD  dicyclomine (BENTYL) 20 MG tablet Take 1 tablet (20 mg total) by mouth 2 (two) times daily. 06/09/20   Raspet, Erin K, PA-C  DUPIXENT 300 MG/2ML SOPN Inject into the skin. 06/26/21   [provider]  EPIDUO FORTE 0.3-2.5 % GEL  04/12/18   [provider]  fluticasone (CUTIVATE) 0.05 % cream APPLY TO AFFECTED AREA EVERY DAY Patient not taking: Reported on 07/16/2021 03/31/18   [provider]  PARoxetine (PAXIL) 20 MG tablet TAKE 1 TABLET BY MOUTH EVERY DAY 11/07/19   Oletta Darter, MD  rizatriptan (MAXALT) 5 MG tablet TAKE 1 TABLET (5 MG TOTAL) BY MOUTH AS NEEDED FOR MIGRAINE. MAY REPEAT IN 2 HOURS IF NEEDED 08/04/21   Bing Neighbors, FNP  tacrolimus (PROTOPIC) 0.03 % ointment APPLY  TO AFFECTED AREA TWICE A DAY Patient not taking: Reported on 07/16/2021 03/31/18   [provider]  triamcinolone cream (KENALOG) 0.1 % Apply to atopic dermatitis on ears and neck 1-2 times a day as needed Patient not taking: Reported on 07/16/2021 02/16/17   [provider]    Family History Family History  Problem Relation Age of Onset   Hypertension Mother    Healthy Father    Depression Sister    Anxiety disorder Sister    Depression Brother    Anxiety disorder Brother     Social History Social History   Tobacco Use   Smoking status: Never   Smokeless tobacco: Never  Vaping Use   Vaping Use: Never used  Substance Use Topics    Alcohol use: Yes    Comment: occ   Drug use: No     Allergies   Patient has no known allergies.   Review of Systems Review of Systems Pertinent negatives listed in HPI   Physical Exam Triage Vital Signs ED Triage Vitals  Enc Vitals Group     BP 08/04/21 1321 111/77     Pulse Rate 08/04/21 1321 70     Resp 08/04/21 1321 20     Temp 08/04/21 1321 98.6 F (37 C)     Temp Source 08/04/21 1321 Oral     SpO2 08/04/21 1321 100 %     Weight 08/04/21 1315 120 lb (54.4 kg)     Height 08/04/21 1315 5\' 1"  (1.549 m)     Head Circumference --      Peak Flow --      Pain Score 08/04/21 1315 0     Pain Loc --      Pain Edu? --      Excl. in GC? --    No data found.  Updated Vital Signs BP 111/77 (BP Location: Right Arm)   Pulse 70   Temp 98.6 F (37 C) (Oral)   Resp 20   Ht 5\' 1"  (1.549 m)   Wt 120 lb (54.4 kg)   LMP 02/17/2021 (Approximate) Comment: Last injection in 04/2021--pt states no chance of pregnancy  SpO2 100%   BMI 22.67 kg/m   Visual Acuity Right Eye Distance:   Left Eye Distance:   Bilateral Distance:    Right Eye Near:   Left Eye Near:    Bilateral Near:     Physical Exam Constitutional:      Appearance: She is well-developed.  HENT:     Head: Normocephalic.   Eyes:     Extraocular Movements: Extraocular movements intact.     Right eye: No nystagmus.     Left eye: No nystagmus.     Conjunctiva/sclera: Conjunctivae normal.  Cardiovascular:     Rate and Rhythm: Normal rate and regular rhythm.  Musculoskeletal:     Cervical back: No rigidity. Pain with movement and muscular tenderness present. No spinous process tenderness. Normal range of motion.  Neurological:     Mental Status: She is alert.  Psychiatric:        Attention and Perception: Attention normal.        Mood and Affect: Affect is flat.        Speech: Speech normal.        Behavior: Behavior normal.        Thought Content: Thought content normal.        Judgment: Judgment normal.     UC Treatments / Results  Labs (all labs  ordered are listed, but only abnormal results are displayed) Labs Reviewed - No data to display  EKG   Radiology No results found.  Procedures Procedures (including critical care time)  Medications Ordered in UC Medications - No data to display  Initial Impression / Assessment and Plan / UC Course  I have reviewed the triage vital signs and the nursing notes.  Pertinent labs & imaging results that were available during my care of the patient were reviewed by me and considered in my medical decision making (see chart for details).    Recurrent headache, occipital left  Nausea and vomiting  Discontinue taking both Baclofen and Maxalt simultaneously and allow at least 4 hours between doses, suspect this may be inducing nausea. Refilled Maxalt. Metoclopramide for nausea PRN. Follow-up with PCP to coordinate a referral to headache specialist or neurologist for further work-up of symptoms. Final Clinical Impressions(s) / UC Diagnoses   Final diagnoses:  Recurrent headache, occipital left   Nausea without vomiting     Discharge Instructions      Please follow-up with your primary care doctor to discuss a referral to a headache specialist vs a neurologist. Continue Baclofen previously prescribed, however when taking Maxalt allow at least 4 hours between medication as I suspect this may be causing nausea. I have prescribed metoclopramide for nausea and this can help with headache pain. I have refilled the Rizatriptan.  If you experience any severe, unrelenting headache pain that is unrelieved with the medications prescribed prior to following up with your primary care doctor go to the nearest emergency department.    ED Prescriptions     Medication Sig Dispense Auth. Provider   metoCLOPramide (REGLAN) 10 MG tablet Take 1 tablet (10 mg total) by mouth 2 (two) times daily as needed for nausea or vomiting. 30 tablet Bing Neighbors,  FNP   rizatriptan (MAXALT) 5 MG tablet TAKE 1 TABLET (5 MG TOTAL) BY MOUTH AS NEEDED FOR MIGRAINE. MAY REPEAT IN 2 HOURS IF NEEDED 10 tablet Bing Neighbors, FNP      PDMP not reviewed this encounter.   Bing Neighbors, FNP 08/04/21 1432

## 2022-01-04 ENCOUNTER — Ambulatory Visit (HOSPITAL_COMMUNITY)
Admission: EM | Admit: 2022-01-04 | Discharge: 2022-01-04 | Disposition: A | Discharge status: Home / Self Care | Payer: BC Managed Care – PPO

## 2022-01-04 ENCOUNTER — Encounter (HOSPITAL_COMMUNITY): Payer: Self-pay

## 2022-01-04 DIAGNOSIS — R259 Unspecified abnormal involuntary movements: Secondary | ICD-10-CM | POA: Diagnosis not present

## 2022-01-04 NOTE — ED Provider Notes (Signed)
[facility]    CSN: [id number] Arrival date & time: 01/04/22  1727      History   Chief Complaint Chief Complaint  Patient presents with   involuntary tongue movement    HPI Melanie Meyer is a 26 y.o. female. Patient c/o intermittent involuntary tongue movement x 2 months. Patient reports she's taken baclofen with some relief of symptoms at times. Patient reports she notices that the involuntary tongue movement occur when she's missed doses of her paxil medication. Patient denies any numbness or tingling in tongue. Patient denies any history similar symptoms.   Patient reports increased stress this past week.  HPI  Past Medical History:  Diagnosis Date   ADHD    Anxiety    Depression    Frequent headaches     Patient Active Problem List   Diagnosis Date Noted   Major depression single episode, in partial remission (HCC) 03/27/2018   Generalized anxiety disorder 03/27/2018   Dysmenorrhea in adolescent 10/25/2013   Headache 11/17/2012   ANKLE SPRAIN, RIGHT 06/03/2009    History reviewed. No pertinent surgical history.  OB History   No obstetric history on file.      Home Medications    Prior to Admission medications   Medication Sig Start Date End Date Taking? Authorizing Provider  ADDERALL XR 10 MG 24 hr capsule Take 10 mg by mouth every morning. 06/29/20   [provider]  anti-nausea (EMETROL) solution Take 10 mLs by mouth every 15 (fifteen) minutes as needed for nausea or vomiting.    [provider]  baclofen (LIORESAL) 10 MG tablet Take 1 tablet (10 mg total) by mouth 3 (three) times daily. 07/16/21   Trevor Iha, FNP  baclofen (LIORESAL) 10 MG tablet Take 1 tablet (10 mg total) by mouth 3 (three) times daily. 07/16/21   Trevor Iha, FNP  busPIRone (BUSPAR) 10 MG tablet TAKE 1 TABLET BY MOUTH TWICE A DAY 09/30/19   Oletta Darter, MD  dicyclomine (BENTYL) 20 MG tablet Take 1 tablet (20 mg total) by mouth 2 (two) times  daily. 06/09/20   Raspet, Erin K, PA-C  DUPIXENT 300 MG/2ML SOPN Inject into the skin. 06/26/21   [provider]  EPIDUO FORTE 0.3-2.5 % GEL  04/12/18   [provider]  fluticasone (CUTIVATE) 0.05 % cream APPLY TO AFFECTED AREA EVERY DAY Patient not taking: Reported on 07/16/2021 03/31/18   [provider]  metoCLOPramide (REGLAN) 10 MG tablet Take 1 tablet (10 mg total) by mouth 2 (two) times daily as needed for nausea or vomiting. 08/04/21   Bing Neighbors, FNP  PARoxetine (PAXIL) 20 MG tablet TAKE 1 TABLET BY MOUTH EVERY DAY 11/07/19   Oletta Darter, MD  rizatriptan (MAXALT) 5 MG tablet TAKE 1 TABLET (5 MG TOTAL) BY MOUTH AS NEEDED FOR MIGRAINE. MAY REPEAT IN 2 HOURS IF NEEDED 08/04/21   Bing Neighbors, FNP  tacrolimus (PROTOPIC) 0.03 % ointment APPLY TO AFFECTED AREA TWICE A DAY Patient not taking: Reported on 07/16/2021 03/31/18   [provider]  triamcinolone cream (KENALOG) 0.1 % Apply to atopic dermatitis on ears and neck 1-2 times a day as needed Patient not taking: Reported on 07/16/2021 02/16/17   [provider]    Family History Family History  Problem Relation Age of Onset   Hypertension Mother    Healthy Father    Depression Sister    Anxiety disorder Sister    Depression Brother    Anxiety disorder Brother  Social History Social History   Tobacco Use   Smoking status: Never   Smokeless tobacco: Never  Vaping Use   Vaping Use: Never used  Substance Use Topics   Alcohol use: Yes    Comment: occ   Drug use: No     Allergies   Patient has no known allergies.   Review of Systems Review of Systems   Physical Exam Triage Vital Signs ED Triage Vitals  Enc Vitals Group     BP 01/04/22 1832 128/74     Pulse Rate 01/04/22 1832 72     Resp 01/04/22 1832 12     Temp 01/04/22 1832 98.7 F (37.1 C)     Temp Source 01/04/22 1832 Oral     SpO2 01/04/22 1832 98 %     Weight --      Height --      Head  Circumference --      Peak Flow --      Pain Score 01/04/22 1831 0     Pain Loc --      Pain Edu? --      Excl. in GC? --    No data found.  Updated Vital Signs BP 128/74 (BP Location: Left Arm)   Pulse 72   Temp 98.7 F (37.1 C) (Oral)   Resp 12   LMP  (LMP Unknown)   SpO2 98%   Visual Acuity Right Eye Distance:   Left Eye Distance:   Bilateral Distance:    Right Eye Near:   Left Eye Near:    Bilateral Near:     Physical Exam   UC Treatments / Results  Labs (all labs ordered are listed, but only abnormal results are displayed) Labs Reviewed - No data to display  EKG   Radiology No results found.  Procedures Procedures (including critical care time)  Medications Ordered in UC Medications - No data to display  Initial Impression / Assessment and Plan / UC Course  I have reviewed the triage vital signs and the nursing notes.  Pertinent labs & imaging results that were available during my care of the patient were reviewed by me and considered in my medical decision making (see chart for details).     Follow up with PCP on Anxiety regimen. Take Paxil on a schedule. Pay attention to pattern of symptomology.  Final Clinical Impressions(s) / UC Diagnoses   Final diagnoses:  None   Discharge Instructions   None    ED Prescriptions   None    PDMP not reviewed this encounter.

## 2022-01-04 NOTE — Discharge Instructions (Addendum)
Please begin taking your Paxil on a regimented schedule each day, it may help to set an alarm to remember to take your medication.   Please go ahead and schedule a follow-up with your PCP, I think it is very important that you find time for this due to finding the appropriate anxiety management and obtaining basic lab work.

## 2022-01-04 NOTE — ED Triage Notes (Signed)
Pt is here for involuntary tongue movement on and off x2 months

## 2022-06-08 ENCOUNTER — Encounter (HOSPITAL_COMMUNITY): Payer: Self-pay

## 2022-06-08 ENCOUNTER — Ambulatory Visit (HOSPITAL_COMMUNITY)
Admission: EM | Admit: 2022-06-08 | Discharge: 2022-06-08 | Disposition: A | Discharge status: Home / Self Care | Payer: BC Managed Care – PPO

## 2022-06-08 DIAGNOSIS — H1033 Unspecified acute conjunctivitis, bilateral: Secondary | ICD-10-CM

## 2022-06-08 MED ORDER — MOXIFLOXACIN HCL 0.5 % OP SOLN: 1.0000 [drp] | Freq: Three times a day (TID) | OPHTHALMIC | 0 refills | Status: AC

## 2022-06-08 NOTE — ED Provider Notes (Signed)
[facility]    CSN: [id number] Arrival date & time: 06/08/22  1541      History   Chief Complaint Chief Complaint  Patient presents with   Eye Problem    HPI Melanie Meyer is a 27 y.o. female.  Woke up this morning with itchy, irritated eyes.  Had some greenish discharge at first.  Denies any pain or fever, no vision changes.  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 No pertinent surgical history.  OB History   No obstetric history on file.      Home Medications    Prior to Admission medications   Medication Sig Start Date End Date Taking? Authorizing Provider  busPIRone (BUSPAR) 10 MG tablet TAKE 1 TABLET BY MOUTH TWICE A DAY 09/30/19  Yes Oletta Darter, MD  DUPIXENT 300 MG/2ML SOPN Inject into the skin. 06/26/21  Yes [provider]  methylphenidate 18 MG PO CR tablet Take 18 mg by mouth every morning. 06/02/22  Yes [provider]  moxifloxacin (VIGAMOX) 0.5 % ophthalmic solution Place 1 drop into both eyes 3 (three) times daily. 06/08/22  Yes Divonte Senger, Lurena Joiner, PA-C  PARoxetine (PAXIL) 20 MG tablet TAKE 1 TABLET BY MOUTH EVERY DAY 11/07/19  Yes Oletta Darter, MD  rizatriptan (MAXALT) 5 MG tablet TAKE 1 TABLET (5 MG TOTAL) BY MOUTH AS NEEDED FOR MIGRAINE. MAY REPEAT IN 2 HOURS IF NEEDED 08/04/21  Yes Bing Neighbors, NP  baclofen (LIORESAL) 10 MG tablet Take 1 tablet (10 mg total) by mouth 3 (three) times daily. 07/16/21   Trevor Iha, FNP  methylphenidate 27 MG PO CR tablet Take 27 mg  by mouth every morning. 04/10/22   [provider]    Family History Family History  Problem Relation Age of Onset   Hypertension Mother    Healthy Father    Depression Sister    Anxiety disorder Sister    Depression Brother    Anxiety disorder Brother     Social History Social History   Tobacco Use   Smoking status: Never   Smokeless tobacco: Never  Vaping Use   Vaping Use: Never used  Substance Use Topics   Alcohol use: Yes    Comment: occ   Drug use: No     Allergies   Patient has no known allergies.   Review of Systems Review of Systems As per HPI  Physical Exam Triage Vital Signs ED Triage Vitals  Enc Vitals Group     BP 06/08/22 1615 (!) 138/92     Pulse Rate 06/08/22 1615 (!) 102     Resp 06/08/22 1615 18     Temp 06/08/22 1615 100.3 F (37.9 C)     Temp Source 06/08/22 1615 Oral     SpO2 06/08/22 1615 99 %     Weight 06/08/22 1610 145 lb (65.8 kg)     Height 06/08/22 1610 5\' 1"  (1.549 m)     Head Circumference --      Peak Flow --  Pain Score 06/08/22 1610 0     Pain Loc --      Pain Edu? --      Excl. in GC? --    No data found.  Updated Vital Signs BP 122/86 (BP Location: Left Arm)   Pulse 94   Temp 100.3 F (37.9 C) (Oral)   Resp 18   Ht  (1.549 m)   Wt 145 lb (65.8 kg)   LMP 05/19/2022 (Exact Date)   SpO2 99%   BMI 27.40 kg/m   Visual Acuity Right Eye Distance: 20/20 (Uncorrected) Left Eye Distance: 20/20 (Uncorrected) Bilateral Distance: 20/20 (Uncorrected)  Right Eye Near:   Left Eye Near:    Bilateral Near:     Physical Exam Vitals and nursing note reviewed.  Constitutional:      General: She is not in acute distress. HENT:     Right Ear: Tympanic membrane and ear canal normal.     Left Ear: Tympanic membrane and ear canal normal.     Nose: No congestion or rhinorrhea.     Mouth/Throat:     Mouth: Mucous membranes are moist.     Pharynx: Oropharynx is clear. No posterior oropharyngeal erythema.   Eyes:     General: Lids are normal. Vision grossly intact.        Right eye: No discharge.        Left eye: No discharge.     Extraocular Movements: Extraocular movements intact.     Conjunctiva/sclera: Conjunctivae normal.     Pupils: Pupils are equal, round, and reactive to light.  Cardiovascular:     Rate and Rhythm: Normal rate and regular rhythm.     Pulses: Normal pulses.     Heart sounds: Normal heart sounds.  Pulmonary:     Effort: Pulmonary effort is normal.     Breath sounds: Normal breath sounds.  Musculoskeletal:     Cervical back: Normal range of motion.  Lymphadenopathy:     Cervical: No cervical adenopathy.  Skin:    General: Skin is warm and dry.  Neurological:     Mental Status: She is alert and oriented to person, place, and time.      UC Treatments / Results  Labs (all labs ordered are listed, but only abnormal results are displayed) Labs Reviewed - No data to display  EKG   Radiology No results found.  Procedures Procedures (including critical care time)  Medications Ordered in UC Medications - No data to display  Initial Impression / Assessment and Plan / UC Course  I have reviewed the triage vital signs and the nursing notes.  Pertinent labs & imaging results that were available during my care of the patient were reviewed by me and considered in my medical decision making (see chart for details).  Good exam.  She describes some green discharge from the eyes this morning although not noted on exam tonight.  Will cover with Vigamox TID. A little low grade temp on arrival but patient feeling fine. Recommend tylenol or ibuprofen. Work note provided, return precautions discussed.  Final Clinical Impressions(s) / UC Diagnoses   Final diagnoses:  Acute bacterial conjunctivitis of both eyes     Discharge Instructions      Use the eyedrops 3 times daily in both eyes, for the next 5 to 7 days  Tylenol or ibuprofen for fever/pain  Drink  lots of water!    ED Prescriptions     Medication Sig Dispense Auth. Provider  moxifloxacin (VIGAMOX) 0.5 % ophthalmic solution Place 1 drop into both eyes 3 (three) times daily. 3 mL Jeorgia Helming, Lurena Joiner, PA-C      PDMP not reviewed this encounter.   Telesa Jeancharles, Ray Church 06/08/22 1728

## 2022-06-08 NOTE — Discharge Instructions (Addendum)
Use the eyedrops 3 times daily in both eyes, for the next 5 to 7 days  Tylenol or ibuprofen for fever/pain  Drink lots of water!

## 2022-06-08 NOTE — ED Notes (Signed)
Reviewed work note 

## 2022-06-08 NOTE — ED Triage Notes (Signed)
"  I woke up with both eyes itchy, some drainage from right eye". "They were pink when first waking up, toned down now". No injury. No fever.

## 2023-04-08 DIAGNOSIS — Z6826 Body mass index (BMI) 26.0-26.9, adult: Secondary | ICD-10-CM | POA: Diagnosis not present

## 2023-04-08 DIAGNOSIS — F900 Attention-deficit hyperactivity disorder, predominantly inattentive type: Secondary | ICD-10-CM | POA: Diagnosis not present

## 2023-04-08 DIAGNOSIS — Z124 Encounter for screening for malignant neoplasm of cervix: Secondary | ICD-10-CM | POA: Diagnosis not present

## 2023-04-08 DIAGNOSIS — N946 Dysmenorrhea, unspecified: Secondary | ICD-10-CM | POA: Diagnosis not present

## 2023-04-08 DIAGNOSIS — Z1331 Encounter for screening for depression: Secondary | ICD-10-CM | POA: Diagnosis not present

## 2023-04-08 DIAGNOSIS — Z0000 Encounter for general adult medical examination without abnormal findings: Secondary | ICD-10-CM | POA: Diagnosis not present

## 2023-04-08 DIAGNOSIS — Z2821 Immunization not carried out because of patient refusal: Secondary | ICD-10-CM | POA: Diagnosis not present

## 2023-04-08 DIAGNOSIS — F411 Generalized anxiety disorder: Secondary | ICD-10-CM | POA: Diagnosis not present

## 2023-06-25 DIAGNOSIS — F329 Major depressive disorder, single episode, unspecified: Secondary | ICD-10-CM | POA: Diagnosis not present

## 2023-07-24 DIAGNOSIS — F411 Generalized anxiety disorder: Secondary | ICD-10-CM | POA: Diagnosis not present

## 2023-08-20 DIAGNOSIS — F411 Generalized anxiety disorder: Secondary | ICD-10-CM | POA: Diagnosis not present

## 2023-09-03 DIAGNOSIS — Z30011 Encounter for initial prescription of contraceptive pills: Secondary | ICD-10-CM | POA: Diagnosis not present

## 2023-10-09 DIAGNOSIS — N3289 Other specified disorders of bladder: Secondary | ICD-10-CM | POA: Diagnosis not present

## 2023-10-09 DIAGNOSIS — K529 Noninfective gastroenteritis and colitis, unspecified: Secondary | ICD-10-CM | POA: Diagnosis not present

## 2023-10-16 DIAGNOSIS — K529 Noninfective gastroenteritis and colitis, unspecified: Secondary | ICD-10-CM | POA: Diagnosis not present

## 2023-10-16 DIAGNOSIS — K219 Gastro-esophageal reflux disease without esophagitis: Secondary | ICD-10-CM | POA: Diagnosis not present

## 2023-12-02 DIAGNOSIS — F4322 Adjustment disorder with anxiety: Secondary | ICD-10-CM | POA: Diagnosis not present

## 2023-12-16 DIAGNOSIS — F4322 Adjustment disorder with anxiety: Secondary | ICD-10-CM | POA: Diagnosis not present

## 2023-12-30 DIAGNOSIS — F4322 Adjustment disorder with anxiety: Secondary | ICD-10-CM | POA: Diagnosis not present

## 2024-01-20 DIAGNOSIS — F4322 Adjustment disorder with anxiety: Secondary | ICD-10-CM | POA: Diagnosis not present

## 2024-02-05 DIAGNOSIS — G8929 Other chronic pain: Secondary | ICD-10-CM | POA: Diagnosis not present

## 2024-02-05 DIAGNOSIS — M62830 Muscle spasm of back: Secondary | ICD-10-CM | POA: Diagnosis not present

## 2024-02-05 DIAGNOSIS — M25511 Pain in right shoulder: Secondary | ICD-10-CM | POA: Diagnosis not present
